# Patient Record
Sex: Female | Born: 1965 | Race: White | Hispanic: No | Marital: Single | Smoking: Never smoker
Health system: Southern US, Community
[De-identification: ages and names within clinical notes are randomized; demographics above are authoritative.]

## PROBLEM LIST (undated history)

## (undated) DIAGNOSIS — I2510 Atherosclerotic heart disease of native coronary artery without angina pectoris: Secondary | ICD-10-CM

## (undated) DIAGNOSIS — I4891 Unspecified atrial fibrillation: Secondary | ICD-10-CM

## (undated) DIAGNOSIS — Z853 Personal history of malignant neoplasm of breast: Secondary | ICD-10-CM

## (undated) DIAGNOSIS — E119 Type 2 diabetes mellitus without complications: Secondary | ICD-10-CM

## (undated) DIAGNOSIS — Z8739 Personal history of other diseases of the musculoskeletal system and connective tissue: Secondary | ICD-10-CM

## (undated) DIAGNOSIS — H5440 Blindness, one eye, unspecified eye: Secondary | ICD-10-CM

## (undated) DIAGNOSIS — F319 Bipolar disorder, unspecified: Secondary | ICD-10-CM

## (undated) DIAGNOSIS — J449 Chronic obstructive pulmonary disease, unspecified: Secondary | ICD-10-CM

## (undated) DIAGNOSIS — R55 Syncope and collapse: Secondary | ICD-10-CM

## (undated) DIAGNOSIS — I219 Acute myocardial infarction, unspecified: Secondary | ICD-10-CM

## (undated) DIAGNOSIS — G4733 Obstructive sleep apnea (adult) (pediatric): Secondary | ICD-10-CM

## (undated) HISTORY — DX: Bipolar disorder, unspecified: F31.9

## (undated) HISTORY — DX: Blindness, one eye, unspecified eye: H54.40

## (undated) HISTORY — DX: Type 2 diabetes mellitus without complications: E11.9

## (undated) HISTORY — DX: Personal history of malignant neoplasm of breast: Z85.3

## (undated) HISTORY — DX: Atherosclerotic heart disease of native coronary artery without angina pectoris: I25.10

## (undated) HISTORY — DX: Obstructive sleep apnea (adult) (pediatric): G47.33

## (undated) HISTORY — PX: CHOLECYSTECTOMY: SHX55

## (undated) HISTORY — PX: TONSILLECTOMY: SUR1361

## (undated) HISTORY — DX: Acute myocardial infarction, unspecified: I21.9

## (undated) HISTORY — DX: Chronic obstructive pulmonary disease, unspecified: J44.9

## (undated) HISTORY — DX: Morbid (severe) obesity due to excess calories: E66.01

## (undated) HISTORY — PX: MASTECTOMY: SHX3

## (undated) HISTORY — DX: Syncope and collapse: R55

## (undated) HISTORY — DX: Unspecified atrial fibrillation: I48.91

## (undated) HISTORY — PX: ROUX-EN-Y PROCEDURE: SUR1287

## (undated) HISTORY — PX: HYSTERECTOMY ABDOMINAL WITH SALPINGECTOMY: SHX6725

## (undated) HISTORY — DX: Personal history of other diseases of the musculoskeletal system and connective tissue: Z87.39

---

## 1998-09-07 HISTORY — PX: BREAST LUMPECTOMY: SHX2

## 2004-09-07 DIAGNOSIS — I219 Acute myocardial infarction, unspecified: Secondary | ICD-10-CM

## 2004-09-07 HISTORY — DX: Acute myocardial infarction, unspecified: I21.9

## 2010-09-07 HISTORY — PX: PERCUTANEOUS CORONARY STENT INTERVENTION (PCI-S): SHX6016

## 2010-09-07 HISTORY — PX: CARDIAC CATHETERIZATION: SHX172

## 2016-02-27 ENCOUNTER — Encounter: Payer: Self-pay | Admitting: *Deleted

## 2016-02-28 ENCOUNTER — Ambulatory Visit (INDEPENDENT_AMBULATORY_CARE_PROVIDER_SITE_OTHER): Payer: Medicare Other | Admitting: Cardiology

## 2016-02-28 ENCOUNTER — Encounter: Payer: Self-pay | Admitting: Cardiology

## 2016-02-28 VITALS — BP 124/86 | HR 73 | Ht 66.0 in | Wt 311.0 lb

## 2016-02-28 DIAGNOSIS — I2510 Atherosclerotic heart disease of native coronary artery without angina pectoris: Secondary | ICD-10-CM

## 2016-02-28 DIAGNOSIS — R55 Syncope and collapse: Secondary | ICD-10-CM | POA: Diagnosis not present

## 2016-02-28 DIAGNOSIS — R002 Palpitations: Secondary | ICD-10-CM

## 2016-02-28 MED ORDER — MIDODRINE HCL 5 MG PO TABS: 5.0000 mg | ORAL_TABLET | Freq: Two times a day (BID) | ORAL | Status: DC

## 2016-02-28 NOTE — Progress Notes (Signed)
Clinical Summary [name] is a 50 y.o.female seen today as a new patient, she is referrred by [facility] for the following medical problems.   1. Syncope - recent admission to [facility] with syncope 02/12/16 - no arrhythmias noted on telemetry, EKG reviewed and shows sinus brady to 52 and 1st degree AV block.  - 02/2016 CT PE negative - previous 24 hr holter 01/2012 with SVT lasting a few minutes, up to 150 bpm. Occasional brady episodes to high 40s - echo 01/2012 LVEF 69-79%, normal diastolic function, no significantl valve disease - episode occurred while at [facility]. Stood up from car, took a few steps and fell. Felt heart racing initially when she stood up. Just blacked out. Transient LOC and fell to ground. Tried to get up but felt lightheaded again. Came to felt dizzy, heart was racing.  - can have orthostatic symptoms chronically, either lying to sitting or sitting to standing - previous episodes of syncope. Last one early January. Occurred while standing in kitchen shortly after standing.  - total episodes around 20-25 episodes over the last several yearsEpisodes typically occur with position change  - drinks gallon of flavored water a day, occasional sodas, occaional tea, no energy drinks, coffee x 2 cups in AM, occasional EtoH - takes lasix about twice a week only   2. Palpitations - previous holter 01/2012 with SVT lasting a few minutes, up to 150 bpm.  - reports occasional palpitaitons.    3. CAD - reports previous MI in 2006 at [place], [place]. Treated medically - reports procedure in 2012 where something was placed in her heart, some type of valve she is unsure     Past Medical History  Diagnosis Date  . CAD (coronary artery disease)     saw Dr. Luiz Riley in [place]  . Myocardial infarction ([place]) 2006  . Syncope   . Atrial fibrillation ([place])   . COPD (chronic obstructive pulmonary disease) ([place])     never smoked  . Diabetes mellitus ([place])     type 2  .  Bipolar mood disorder ([place])     followed by psychiatrist and is on lithium  . H/O juvenile rheumatoid arthritis     w/o adult manifestations  . OSA (obstructive sleep apnea)     on CPAP  . HX: breast cancer   . Morbid obesity ([place])   . 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 Bee Venom Anaphylaxis  . Latex Anaphylaxis  . Erythromycin Rash  . Milk-Related Compounds Nausea Only and Rash  . Nylon Rash  . Poison [employer] Poison Ivy] Hives and Rash    [place], [place], [place]  . Soap Rash    Dial and Ivory soap  . Sulfa Antibiotics Rash     Current Outpatient Prescriptions  Medication Sig Dispense Refill  . ALPRAZolam (XANAX) 0.5 MG tablet Take 0.5 mg by mouth 3 (three) times daily as needed.     Marland Kitchen aspirin EC 81 MG tablet Take 81 mg by mouth daily.    . Cyanocobalamin (B-12) 1000 MCG/ML KIT Inject 1,000 mcg as directed once a week.    . Cyanocobalamin (B-12) 2500 MCG TABS Take 1 tablet by mouth daily.    Marland Kitchen EPINEPHrine 0.3 mg/0.3 mL IJ SOAJ injection Inject 0.3 mg into the muscle as directed.    Marland Kitchen esomeprazole (NEXIUM) 40 MG capsule Take 40  mg by mouth daily as needed.     . Fluticasone-Salmeterol (ADVAIR DISKUS) 500-50 MCG/DOSE AEPB Inhale 1 puff into the lungs as needed.     . folic acid (FOLVITE) 970 MCG tablet Take 800 mcg by mouth daily.    . furosemide (LASIX) 20 MG tablet Take 40 mg by mouth daily as needed.     . hydrOXYzine (ATARAX/VISTARIL) 25 MG tablet Take 25 mg by mouth as needed.    . lithium carbonate 300 MG capsule Take 300 mg by mouth 2 (two) times daily with a meal.    . meclizine (ANTIVERT) 25 MG tablet Take 25 mg by mouth 3 (three) times daily as needed for dizziness.    . montelukast (SINGULAIR) 10 MG tablet Take 10 mg by mouth at bedtime.    . Multiple Vitamin (MULTIVITAMIN) tablet Take 1 tablet by mouth daily.    . nebivolol (BYSTOLIC) 2.5 MG tablet Take 2.5 mg by mouth daily.    . nitroGLYCERIN  (NITROSTAT) 0.4 MG SL tablet Place 0.4 mg under the tongue every 5 (five) minutes as needed for chest pain.    . potassium chloride (K-DUR) 10 MEQ tablet Take 10 mEq by mouth daily.    . pramipexole (MIRAPEX) 1 MG tablet Take 1 tablet by mouth 2 (two) times daily.  1  . PROAIR HFA 108 (90 Base) MCG/ACT inhaler Inhale 2 puffs into the lungs 3 (three) times daily as needed.  5  . sertraline (ZOLOFT) 100 MG tablet Take 200 mg by mouth daily.     Marland Kitchen topiramate (TOPAMAX) 100 MG tablet Take 100 mg by mouth 2 (two) times daily.    . traMADol (ULTRAM) 50 MG tablet Take 50 mg by mouth every 6 (six) hours as needed.     . midodrine (PROAMATINE) 5 MG tablet Take 1 tablet (5 mg total) by mouth 2 (two) times daily with a meal. 60 tablet 3   No current facility-administered medications for this visit.     Past Surgical History  Procedure Laterality Date  . Cardiac catheterization  2012    stent placement  . Percutaneous coronary stent intervention (pci-s)  2012  . Breast lumpectomy  2000  . Mastectomy Bilateral 2014 & 2015  . Roux-en-y procedure      [facility]  . Cholecystectomy    . Tonsillectomy    . Hysterectomy abdominal with salpingectomy       Allergies  Allergen Reactions  . Bee Venom Anaphylaxis  . Latex Anaphylaxis  . Erythromycin Rash  . Milk-Related Compounds Nausea Only and Rash  . Nylon Rash  . Poison [employer] Poison Ivy] Hives and Rash    [place], [place], [place]  . Soap Rash    Dial and Ivory soap  . Sulfa Antibiotics Rash      No family history on file.   Social History [name] reports that she has never smoked. She has never used smokeless tobacco. [name] has no alcohol history on file.   Review of Systems CONSTITUTIONAL: No weight loss, fever, chills, weakness or fatigue.  HEENT: Eyes: No visual loss, blurred vision, double vision or yellow sclerae.No hearing loss, sneezing, congestion, runny nose or sore throat.  SKIN: No rash or itching.    CARDIOVASCULAR: per HPI RESPIRATORY: No shortness of breath, cough or sputum.  GASTROINTESTINAL: No anorexia, nausea, vomiting or diarrhea. No abdominal pain or blood.  GENITOURINARY: No burning on urination, no polyuria NEUROLOGICAL: per HPI  MUSCULOSKELETAL: No muscle, back pain,  joint pain or stiffness.  LYMPHATICS: No enlarged nodes. No history of splenectomy.  PSYCHIATRIC: No history of depression or anxiety.  ENDOCRINOLOGIC: No reports of sweating, cold or heat intolerance. No polyuria or polydipsia.  Marland Kitchen   Physical Examination Filed Vitals:   02/28/16 1400 02/28/16 1403  BP: 158/76 124/86  Pulse: 70 73   Filed Weights   02/28/16 1336  Weight: 311 lb (141.069 kg)    Gen: resting comfortably, no acute distress HEENT: no scleral icterus, pupils equal round and reactive, no palptable cervical adenopathy,  CV: RRR, no m/r/g, no jvd  Resp: Clear to auscultation bilaterally GI: abdomen is soft, non-tender, non-distended, normal bowel sounds, no hepatosplenomegaly MSK: extremities are warm, no edema.  Skin: warm, no rash Neuro:  no focal deficits Psych: appropriate affect    Assessment and Plan  1. Syncope - episodes consistent with orthostatic syncope, occur with position change. Orthostatics in clinic somewhat mixed, but she does appear to be orthostatic by pulse - reports significant fluid intake. Potentially medication or DM2 related orthostatic changes - we will try midodrine '5mg'$  bid and follow symptoms - the other consideration for her episodes would be arrhythmia, previous monitor a few years ago showed episodes of SVT as well as some episodes of bradycardia. We will repeat 7 day monitor to further evaluate  2. Palpitaitons - obtain 7 day event monitor  3. CAD - unclear history , request records from previous cardiologist.    F/u 3-4 weeks .Repeat orthostatics next visit   Arnoldo Lenis, M.D.

## 2016-02-28 NOTE — Patient Instructions (Signed)
Your physician recommends that you schedule a follow-up appointment in: 3-4 WEEKS WITH DR. BRANCH  Your physician has recommended you make the following change in your medication:   START MIDODRINE 5 MG TWICE DAILY  Your physician has recommended that you wear an event monitor 7 DAYS . Event monitors are medical devices that record the heart's electrical activity. Doctors most often us these monitors to diagnose arrhythmias. Arrhythmias are problems with the speed or rhythm of the heartbeat. The monitor is a small, portable device. You can wear one while you do your normal daily activities. This is usually used to diagnose what is causing palpitations/syncope (passing out).  Thank you for choosing [facility]!!

## 2016-03-12 ENCOUNTER — Ambulatory Visit (INDEPENDENT_AMBULATORY_CARE_PROVIDER_SITE_OTHER): Payer: Medicare Other

## 2016-03-12 DIAGNOSIS — R002 Palpitations: Secondary | ICD-10-CM | POA: Diagnosis not present

## 2016-03-25 ENCOUNTER — Ambulatory Visit (INDEPENDENT_AMBULATORY_CARE_PROVIDER_SITE_OTHER): Payer: Medicare Other | Admitting: Cardiology

## 2016-03-25 ENCOUNTER — Encounter: Payer: Self-pay | Admitting: Cardiology

## 2016-03-25 ENCOUNTER — Telehealth: Payer: Self-pay | Admitting: *Deleted

## 2016-03-25 VITALS — BP 133/96 | HR 84 | Ht 66.0 in | Wt 316.0 lb

## 2016-03-25 DIAGNOSIS — R55 Syncope and collapse: Secondary | ICD-10-CM

## 2016-03-25 DIAGNOSIS — R002 Palpitations: Secondary | ICD-10-CM | POA: Diagnosis not present

## 2016-03-25 MED ORDER — METOPROLOL TARTRATE 25 MG PO TABS: ORAL_TABLET | ORAL | Status: AC

## 2016-03-25 NOTE — Patient Instructions (Signed)
Your physician wants you to follow-up in: 6 MONTHS WITH DR. BRANCH You will receive a reminder letter in the mail two months in advance. If you don't receive a letter, please call our office to schedule the follow-up appointment.  Your physician has recommended you make the following change in your medication:   START LOPRESSOR 12.5 MG EVERY 8 HOURS AS NEEDED   Thank you for choosing [facility]!!

## 2016-03-25 NOTE — Telephone Encounter (Signed)
LM X 2 days. Pt has OV 7/19 with Dr. Wyline MoodBranch

## 2016-03-25 NOTE — Progress Notes (Signed)
Clinical Summary [name] is a 50 y.o.female seen today for follow up of the following medical problems.  1. Syncope - recent admission to [facility] with syncope 02/12/16 - no arrhythmias noted on telemetry, EKG reviewed and shows sinus brady to 52 and 1st degree AV block.  - 02/2016 CT PE negative - previous 24 hr holter 01/2012 with SVT lasting a few minutes, up to 150 bpm. Occasional brady episodes to high 40s - echo 01/2012 LVEF 94-76%, normal diastolic function, no significantl valve disease - episode occurred while at [facility]. Stood up from car, took a few steps and fell. Felt heart racing initially when she stood up. Just blacked out. Transient LOC and fell to ground. Tried to get up but felt lightheaded again. Came to felt dizzy, heart was racing.  - can have orthostatic symptoms chronically, either lying to sitting or sitting to standing - previous episodes of syncope. Last one early January. Occurred while standing in kitchen shortly after standing. - total episodes around 20-25 episodes over the last several yearsEpisodes typically occur with position change - drinks gallon of flavored water a day, occasional sodas, occaional tea, no energy drinks, coffee x 2 cups in AM, occasional EtoH   - 03/2016 event monitor with occasional mild sinus bradycardia, no reported symptoms and no significant arrhythmias - since last visit no recurrent syncope, symptoms improved off bystolic.    2. Palpitations - previous holter 01/2012 with SVT lasting a few minutes, up to 150 bpm.  - 03/2016 event monitor with occasional mild sinus bradycardia, no reported symptoms and no significant arrhythmias - can have mild occasional palpitations.     Past Medical History  Diagnosis Date  . CAD (coronary artery disease)     saw Dr. Luiz [place] in [place]  . Myocardial infarction ([place]) 2006  . Syncope   . Atrial fibrillation ([place])   . COPD (chronic obstructive pulmonary disease) ([place])    never smoked  . Diabetes mellitus ([place])     type 2  . Bipolar mood disorder ([place])     followed by psychiatrist and is on lithium  . H/O juvenile rheumatoid arthritis     w/o adult manifestations  . OSA (obstructive sleep apnea)     on CPAP  . HX: breast cancer   . Morbid obesity ([place])   . 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 Bee Venom Anaphylaxis  . Latex Anaphylaxis  . Erythromycin Rash  . Milk-Related Compounds Nausea Only and Rash  . Nylon Rash  . Poison [employer] Poison Ivy] Hives and Rash    [place], [place], [place]  . Soap Rash    Dial and Ivory soap  . Sulfa Antibiotics Rash     Current Outpatient Prescriptions  Medication Sig Dispense Refill  . ALPRAZolam (XANAX) 0.5 MG tablet Take 0.5 mg by mouth 3 (three) times daily as needed.     Marland Kitchen aspirin EC 81 MG tablet Take 81 mg by mouth daily.    . Cyanocobalamin (B-12) 1000 MCG/ML KIT Inject 1,000 mcg as directed once a week.    . Cyanocobalamin (B-12) 2500 MCG TABS Take 1 tablet by mouth daily.    Marland Kitchen EPINEPHrine 0.3 mg/0.3 mL IJ SOAJ injection Inject 0.3 mg into the muscle as directed.    Marland Kitchen esomeprazole (NEXIUM) 40 MG capsule Take 40 mg by mouth daily as needed.     Marland Kitchen  Fluticasone-Salmeterol (ADVAIR DISKUS) 500-50 MCG/DOSE AEPB Inhale 1 puff into the lungs as needed.     . folic acid (FOLVITE) 676 MCG tablet Take 800 mcg by mouth daily.    . furosemide (LASIX) 20 MG tablet Take 40 mg by mouth daily as needed.     . hydrOXYzine (ATARAX/VISTARIL) 25 MG tablet Take 25 mg by mouth as needed.    . lithium carbonate 300 MG capsule Take 300 mg by mouth 2 (two) times daily with a meal.    . meclizine (ANTIVERT) 25 MG tablet Take 25 mg by mouth 3 (three) times daily as needed for dizziness.    . midodrine (PROAMATINE) 5 MG tablet Take 1 tablet (5 mg total) by mouth 2 (two) times daily with a meal. 60 tablet 3  . montelukast (SINGULAIR) 10 MG tablet Take 10 mg by mouth at  bedtime.    . Multiple Vitamin (MULTIVITAMIN) tablet Take 1 tablet by mouth daily.    . nebivolol (BYSTOLIC) 2.5 MG tablet Take 2.5 mg by mouth daily.    . nitroGLYCERIN (NITROSTAT) 0.4 MG SL tablet Place 0.4 mg under the tongue every 5 (five) minutes as needed for chest pain.    . potassium chloride (K-DUR) 10 MEQ tablet Take 10 mEq by mouth daily.    . pramipexole (MIRAPEX) 1 MG tablet Take 1 tablet by mouth 2 (two) times daily.  1  . PROAIR HFA 108 (90 Base) MCG/ACT inhaler Inhale 2 puffs into the lungs 3 (three) times daily as needed.  5  . sertraline (ZOLOFT) 100 MG tablet Take 200 mg by mouth daily.     Marland Kitchen topiramate (TOPAMAX) 100 MG tablet Take 100 mg by mouth 2 (two) times daily.    . traMADol (ULTRAM) 50 MG tablet Take 50 mg by mouth every 6 (six) hours as needed.      No current facility-administered medications for this visit.     Past Surgical History  Procedure Laterality Date  . Cardiac catheterization  2012    stent placement  . Percutaneous coronary stent intervention (pci-s)  2012  . Breast lumpectomy  2000  . Mastectomy Bilateral 2014 & 2015  . Roux-en-y procedure      [facility]  . Cholecystectomy    . Tonsillectomy    . Hysterectomy abdominal with salpingectomy       Allergies  Allergen Reactions  . Bee Venom Anaphylaxis  . Latex Anaphylaxis  . Erythromycin Rash  . Milk-Related Compounds Nausea Only and Rash  . Nylon Rash  . Poison [employer] Poison Ivy] Hives and Rash    [place], [place], [place]  . Soap Rash    Dial and Ivory soap  . Sulfa Antibiotics Rash      No family history on file.   Social History [name] reports that she has never smoked. She has never used smokeless tobacco. [name] has no alcohol history on file.   Review of Systems CONSTITUTIONAL: No weight loss, fever, chills, weakness or fatigue.  HEENT: Eyes: No visual loss, blurred vision, double vision or yellow sclerae.No hearing loss, sneezing, congestion,  runny nose or sore throat.  SKIN: No rash or itching.  CARDIOVASCULAR: per HPI RESPIRATORY: No shortness of breath, cough or sputum.  GASTROINTESTINAL: No anorexia, nausea, vomiting or diarrhea. No abdominal pain or blood.  GENITOURINARY: No burning on urination, no polyuria NEUROLOGICAL: No headache, dizziness, syncope, paralysis, ataxia, numbness or tingling in the extremities. No change in bowel or bladder control.  MUSCULOSKELETAL: No muscle, back pain, joint pain or stiffness.  LYMPHATICS: No enlarged nodes. No history of splenectomy.  PSYCHIATRIC: No history of depression or anxiety.  ENDOCRINOLOGIC: No reports of sweating, cold or heat intolerance. No polyuria or polydipsia.  Marland Kitchen   Physical Examination Filed Vitals:   03/25/16 1329 03/25/16 1332  BP: 130/88 133/96  Pulse: 83 84   Filed Vitals:   03/25/16 1321  Height: _0  (1.676 m)  Weight: 316 lb (143.337 kg)    Gen: resting comfortably, no acute distress HEENT: no scleral icterus, pupils equal round and reactive, no palptable cervical adenopathy,  CV: RRR, no m/r/g, no jvd Resp: Clear to auscultation bilaterally GI: abdomen is soft, non-tender, non-distended, normal bowel sounds, no hepatosplenomegaly MSK: extremities are warm, no edema.  Skin: warm, no rash Neuro:  no focal deficits Psych: appropriate affect   Diagnostic Studies  01/2012 echo [place]: LVEF 55-60%,  01/2012 Holter monitor: short episodes of SVT  Assessment and Plan   1. Syncope - episodes consistent with orthostatic syncope, occur with position change. Orthostatics in clinic somewhat mixed, but she did appear to be orthostatic by pulse last visit. Normal orthostatics this visit with resolution of symptoms.  - no recurrent symptoms since stopping bystolic and starting midodrine.   2. Palpitaitons - no significant abnormalities on recent monitor - start prn lopressor only, off daily bystolic now.    F/u 6 months     Arnoldo Lenis, M.D.

## 2016-03-25 NOTE — Telephone Encounter (Signed)
-----   Message from Antoine PocheJonathan F Branch, MD sent at 03/20/2016 11:19 AM EDT ----- Please resend this report, it is not showing up in epic to read. Please let her know that the results are normal and put a note in epic   Dominga FerryJ Branch MD ----- Message -----    From: Albertine PatriciaStaci T Dyesha Henault, CMA    Sent: 03/13/2016   3:00 PM      To: Antoine PocheJonathan F Branch, MD  Did it not come to your media manager in your inbox? Im trying to figure out how to upload this again and where it needs to go to   [employer]  Darrian Grzelak

## 2016-10-01 ENCOUNTER — Encounter: Payer: Self-pay | Admitting: Cardiology

## 2016-10-01 ENCOUNTER — Ambulatory Visit (INDEPENDENT_AMBULATORY_CARE_PROVIDER_SITE_OTHER): Payer: Medicare Other | Admitting: Cardiology

## 2016-10-01 VITALS — BP 110/78 | HR 87 | Ht 66.0 in | Wt 291.0 lb

## 2016-10-01 DIAGNOSIS — M79604 Pain in right leg: Secondary | ICD-10-CM | POA: Diagnosis not present

## 2016-10-01 DIAGNOSIS — R002 Palpitations: Secondary | ICD-10-CM | POA: Diagnosis not present

## 2016-10-01 DIAGNOSIS — M79605 Pain in left leg: Secondary | ICD-10-CM

## 2016-10-01 DIAGNOSIS — R55 Syncope and collapse: Secondary | ICD-10-CM

## 2016-10-01 NOTE — Patient Instructions (Addendum)
Medication Instructions:  Your physician recommends that you continue on your current medications as directed. Please refer to the Current Medication list given to you today.   Labwork: I WILL REQUEST A COPY OF LABS FROM PCP  Testing/Procedures: Your physician has requested that you have an ankle brachial index (ABI). During this test an ultrasound and blood pressure cuff are used to evaluate the arteries that supply the arms and legs with blood. Allow thirty minutes for this exam. There are no restrictions or special instructions.    Follow-Up: Your physician wants you to follow-up in: 6 MONTHS. You will receive a reminder letter in the mail two months in advance. If you don't receive a letter, please call our office to schedule the follow-up appointment.   Any Other Special Instructions Will Be Listed Below (If Applicable).     If you need a refill on your cardiac medications before your next appointment, please call your pharmacy.

## 2016-10-01 NOTE — Progress Notes (Signed)
Clinical Summary [name] is a 51 y.o.female seen today for follow up of the following medical problems.  1. Syncope - recent admission to [facility] with syncope 02/12/16 - no arrhythmias noted on telemetry, EKG reviewed and shows sinus brady to 52 and 1st degree AV block.  - 02/2016 CT PE negative - previous 24 hr holter 01/2012 with SVT lasting a few minutes, up to 150 bpm. Occasional brady episodes to high 40s - echo 01/2012 LVEF 16-38%, normal diastolic function, no significantl valve disease - episode occurred while at [facility]. Stood up from car, took a few steps and fell. Felt heart racing initially when she stood up. Just blacked out. Transient LOC and fell to ground. Tried to get up but felt lightheaded again. Came to felt dizzy, heart was racing.  - can have orthostatic symptoms chronically, either lying to sitting or sitting to standing - previous episodes of syncope. Last one early January. Occurred while standing in kitchen shortly after standing. - total episodes around 20-25 episodes over the last several years. Episodes typically occur with position change - drinks gallon of flavored water a day, occasional sodas, occaional tea, no energy drinks, coffee x 2 cups in AM, occasional EtoH   - 03/2016 event monitor with occasional mild sinus bradycardia, no reported symptoms and no significant arrhythmias - since last visit no recurrent syncope, symptoms improved off bystolic.   - no recent syncope. Can have some orthostatic symptoms with standing. Overall doing well with midodrine.   2. Palpitations - previous holter 01/2012 with SVT lasting a few minutes, up to 150 bpm.  - 03/2016 event monitor with occasional mild sinus bradycardia, no reported symptoms and no significant arrhythmias   - has prn lopressor. Only palpitaitons 1-2 times a months  3. Leg pains - bilateral calf pain. Legs stay cold. Stops and rest, resolves.      SH: just married in October  2017.    Past Medical History:  Diagnosis Date  . Atrial fibrillation ([place])   . Bipolar mood disorder ([place])    followed by psychiatrist and is on lithium  . 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 CAD (coronary artery disease)    saw Dr. Luiz Ochoa in [facility]  . COPD (chronic obstructive pulmonary disease) ([place])    never smoked  . Diabetes mellitus ([place])    type 2  . 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 HX: breast cancer   . Morbid obesity ([place])   . Myocardial infarction 2006  . OSA (obstructive sleep apnea)    on CPAP  . Syncope      Allergies  Allergen Reactions  . Bee Venom Anaphylaxis  . Latex Anaphylaxis  . Erythromycin Rash  . Milk-Related Compounds Nausea Only and Rash  . Nylon Rash  . Poison Ivy Extract [Poison Ivy Extract] Hives and Rash    [place], [place]  . Soap Rash    Dial and Ivory soap  . Sulfa Antibiotics Rash     Current Outpatient Prescriptions  Medication Sig Dispense Refill  . ALPRAZolam (XANAX) 0.5 MG tablet Take 0.5 mg by mouth 3 (three) times daily as needed.     Marland Kitchen aspirin EC 81 MG tablet Take 81 mg by mouth daily.    . Cyanocobalamin (B-12) 1000 MCG/ML KIT Inject 1,000 mcg as directed once a week.    . Cyanocobalamin (B-12) 2500 MCG TABS Take 1 tablet  by mouth daily.    Marland Kitchen EPINEPHrine 0.3 mg/0.3 mL IJ SOAJ injection Inject 0.3 mg into the muscle as directed.    Marland Kitchen esomeprazole (NEXIUM) 40 MG capsule Take 40 mg by mouth daily as needed.     . Fluticasone-Salmeterol (ADVAIR DISKUS) 500-50 MCG/DOSE AEPB Inhale 1 puff into the lungs as needed.     . folic acid (FOLVITE) 263 MCG tablet Take 800 mcg by mouth daily.    . furosemide (LASIX) 20 MG tablet Take 40 mg by mouth daily as needed.     . gabapentin (NEURONTIN) 100 MG capsule Take 1 capsule by mouth daily.    . hydrOXYzine (ATARAX/VISTARIL) 25 MG tablet Take 25 mg by mouth as needed.    . lithium carbonate 300 MG capsule Take 300 mg by mouth 2  (two) times daily with a meal.    . meclizine (ANTIVERT) 25 MG tablet Take 25 mg by mouth 3 (three) times daily as needed for dizziness.    . metoprolol tartrate (LOPRESSOR) 25 MG tablet TAKE 12.5 MG EVERY 8 HOURS AS NEEDED 90 tablet 3  . midodrine (PROAMATINE) 5 MG tablet Take 1 tablet (5 mg total) by mouth 2 (two) times daily with a meal. 60 tablet 3  . montelukast (SINGULAIR) 10 MG tablet Take 10 mg by mouth at bedtime.    . Multiple Vitamin (MULTIVITAMIN) tablet Take 1 tablet by mouth daily.    . nitroGLYCERIN (NITROSTAT) 0.4 MG SL tablet Place 0.4 mg under the tongue every 5 (five) minutes as needed for chest pain.    . potassium chloride (K-DUR) 10 MEQ tablet Take 10 mEq by mouth daily.    . pramipexole (MIRAPEX) 1 MG tablet Take 1 tablet by mouth 2 (two) times daily.  1  . PROAIR HFA 108 (90 Base) MCG/ACT inhaler Inhale 2 puffs into the lungs 3 (three) times daily as needed.  5  . sertraline (ZOLOFT) 100 MG tablet Take 200 mg by mouth daily.     Marland Kitchen topiramate (TOPAMAX) 100 MG tablet Take 100 mg by mouth 2 (two) times daily.    . traMADol (ULTRAM) 50 MG tablet Take 50 mg by mouth every 6 (six) hours as needed.     . Vitamin D, Ergocalciferol, (DRISDOL) 50000 units CAPS capsule Take 50,000 Units by mouth once a week.  4   No current facility-administered medications for this visit.      Past Surgical History:  Procedure Laterality Date  . BREAST LUMPECTOMY  2000  . CARDIAC CATHETERIZATION  2012   stent placement  . CHOLECYSTECTOMY    . HYSTERECTOMY ABDOMINAL WITH SALPINGECTOMY    . MASTECTOMY Bilateral 2014 & 2015  . PERCUTANEOUS CORONARY STENT INTERVENTION (PCI-S)  2012  . [place]  . TONSILLECTOMY       Allergies  Allergen Reactions  . Bee Venom Anaphylaxis  . Latex Anaphylaxis  . Erythromycin Rash  . Milk-Related Compounds Nausea Only and Rash  . Nylon Rash  . Poison Ivy Extract [Poison Ivy Extract] Hives and Rash    [place], [place]   . Soap Rash    Dial and Ivory soap  . Sulfa Antibiotics Rash      No family history on file.   Social History [name] reports that she has never smoked. She has never used smokeless tobacco. [name] has no alcohol history on file.   Review of Systems CONSTITUTIONAL: No weight loss, fever, chills, weakness or fatigue.  HEENT: Eyes: No visual loss, blurred vision, double vision or yellow sclerae.No hearing loss, sneezing, congestion, runny nose or sore throat.  SKIN: No rash or itching.  CARDIOVASCULAR: per hpi RESPIRATORY: No shortness of breath, cough or sputum.  GASTROINTESTINAL: No anorexia, nausea, vomiting or diarrhea. No abdominal pain or blood.  GENITOURINARY: No burning on urination, no polyuria NEUROLOGICAL: No headache, dizziness, syncope, paralysis, ataxia, numbness or tingling in the extremities. No change in bowel or bladder control.  MUSCULOSKELETAL: No muscle, back pain, joint pain or stiffness.  LYMPHATICS: No enlarged nodes. No history of splenectomy.  PSYCHIATRIC: No history of depression or anxiety.  ENDOCRINOLOGIC: No reports of sweating, cold or heat intolerance. No polyuria or polydipsia.  Marland Kitchen   Physical Examination Vitals:   10/01/16 1118  BP: 110/78  Pulse: 87   Vitals:   10/01/16 1118  Weight: 291 lb (132 kg)  Height: '5\' 6"'$  (1.676 m)    Gen: resting comfortably, no acute distress HEENT: no scleral icterus, pupils equal round and reactive, no palptable cervical adenopathy,  CV: RRR, no m//r,g no jvd Resp: Clear to auscultation bilaterally GI: abdomen is soft, non-tender, non-distended, normal bowel sounds, no hepatosplenomegaly MSK: extremities are warm, no edema.  Skin: warm, no rash Neuro:  no focal deficits Psych: appropriate affect   Diagnostic Studies 01/2012 echo [place]: LVEF 55-60%,  01/2012 Holter monitor: short episodes of SVT     Assessment and Plan  1. Syncope - episodes consistent with orthostatic syncope, -  no recurrent symptoms since stopping bystolic and starting midodrine.  - continue to monitor.   2. Palpitaitons - no significant abnormalities on recent monitor - overall symptoms are controlled, continue to monitor  3. Leg pains - obtain ABIs.   F/u 6 months      Arnoldo Lenis, M.D., F.A.C.C.

## 2016-11-04 ENCOUNTER — Other Ambulatory Visit: Payer: Self-pay | Admitting: Cardiology

## 2016-11-04 DIAGNOSIS — I739 Peripheral vascular disease, unspecified: Secondary | ICD-10-CM

## 2016-11-12 ENCOUNTER — Ambulatory Visit: Payer: Medicare Other

## 2016-11-12 DIAGNOSIS — I739 Peripheral vascular disease, unspecified: Secondary | ICD-10-CM | POA: Diagnosis not present

## 2016-11-17 ENCOUNTER — Telehealth: Payer: Self-pay | Admitting: *Deleted

## 2016-11-17 NOTE — Telephone Encounter (Signed)
Pt aware - routed to pcp  

## 2016-11-17 NOTE — Telephone Encounter (Signed)
-----   Message from Antoine PocheJonathan F Branch, MD sent at 11/17/2016 12:07 PM EDT ----- Normal circulation in legs based on ultrasound. She should discuss with pcp other possible causes for her pain   Dominga FerryJ Branch MD

## 2017-03-23 ENCOUNTER — Emergency Department (HOSPITAL_COMMUNITY)
Admission: EM | Admit: 2017-03-23 | Discharge: 2017-03-23 | Disposition: A | Discharge status: Home / Self Care | Payer: Medicare Other | Attending: Emergency Medicine | Admitting: Emergency Medicine

## 2017-03-23 ENCOUNTER — Emergency Department (HOSPITAL_COMMUNITY): Payer: Medicare Other

## 2017-03-23 ENCOUNTER — Encounter (HOSPITAL_COMMUNITY): Payer: Self-pay | Admitting: Emergency Medicine

## 2017-03-23 DIAGNOSIS — Y929 Unspecified place or not applicable: Secondary | ICD-10-CM | POA: Insufficient documentation

## 2017-03-23 DIAGNOSIS — Z79899 Other long term (current) drug therapy: Secondary | ICD-10-CM | POA: Diagnosis not present

## 2017-03-23 DIAGNOSIS — S6991XA Unspecified injury of right wrist, hand and finger(s), initial encounter: Secondary | ICD-10-CM | POA: Diagnosis present

## 2017-03-23 DIAGNOSIS — J449 Chronic obstructive pulmonary disease, unspecified: Secondary | ICD-10-CM | POA: Insufficient documentation

## 2017-03-23 DIAGNOSIS — Z7982 Long term (current) use of aspirin: Secondary | ICD-10-CM | POA: Diagnosis not present

## 2017-03-23 DIAGNOSIS — Y999 Unspecified external cause status: Secondary | ICD-10-CM | POA: Diagnosis not present

## 2017-03-23 DIAGNOSIS — E119 Type 2 diabetes mellitus without complications: Secondary | ICD-10-CM | POA: Insufficient documentation

## 2017-03-23 DIAGNOSIS — Z91040 Latex allergy status: Secondary | ICD-10-CM | POA: Diagnosis not present

## 2017-03-23 DIAGNOSIS — Y939 Activity, unspecified: Secondary | ICD-10-CM | POA: Diagnosis not present

## 2017-03-23 MED ORDER — NAPROXEN 500 MG PO TABS: 500.0000 mg | ORAL_TABLET | Freq: Two times a day (BID) | ORAL | 0 refills | Status: DC

## 2017-03-23 NOTE — Discharge Instructions (Signed)
Elevate and apply ice packs on and off to your finger. Keep splinted for at least 1 week. Call the orthopedic doctor listed to arrange a follow-up if her symptoms are not improving.

## 2017-03-23 NOTE — ED Provider Notes (Signed)
[place] [facility] DEPT Provider Note   CSN: [id number] Arrival date & time: 03/23/17  1614     History   Chief Complaint Chief Complaint  Patient presents with  . Hand Injury    HPI Whitney Riley is a 51 y.o. female.  HPI   Whitney Riley is a 51 y.o. female who presents to the Emergency Department complaining of Throbbing pain to her right index and middle fingers. States that her daughter who has bipolar disorder, but her fingers back earlier today. States that she felt a pop in her index finger. She now reports pain with movement of the finger and bruising to the palmar side of her finger. She denies numbness or tingling to the digit, wrist pain, and swelling.  She's not tried any therapies prior to arrival. No other injuries.   Past Medical History:  Diagnosis Date  . Atrial fibrillation ([place])   . Bipolar mood disorder ([place])    followed by psychiatrist and is on lithium  . 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 CAD (coronary artery disease)    saw Dr. Luiz Ochoa in [facility]  . COPD (chronic obstructive pulmonary disease) ([place])    never smoked  . Diabetes mellitus ([place])    type 2  . 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 HX: breast cancer   . Morbid obesity ([place])   . Myocardial infarction ([place]) 2006  . OSA (obstructive sleep apnea)    on CPAP  . Syncope     There are no active problems to display for this patient.   Past Surgical History:  Procedure Laterality Date  . BREAST LUMPECTOMY  2000  . CARDIAC CATHETERIZATION  2012   stent placement  . CHOLECYSTECTOMY    . HYSTERECTOMY ABDOMINAL WITH SALPINGECTOMY    . MASTECTOMY Bilateral 2014 & 2015  . PERCUTANEOUS CORONARY STENT INTERVENTION (PCI-S)  2012  . [place]  . TONSILLECTOMY      OB History    No data available       Home Medications    Prior to Admission medications   Medication Sig Start Date End Date Taking?  Authorizing Provider  ALPRAZolam Duanne Moron) 0.5 MG tablet Take 0.5 mg by mouth 3 (three) times daily as needed.     [provider]  aspirin EC 81 MG tablet Take 81 mg by mouth daily.    [provider]  Cyanocobalamin (B-12) 1000 MCG/ML KIT Inject 1,000 mcg as directed once a week.    [provider]  Cyanocobalamin (B-12) 2500 MCG TABS Take 1 tablet by mouth daily.    [provider]  EPINEPHrine 0.3 mg/0.3 mL IJ SOAJ injection Inject 0.3 mg into the muscle as directed.    [provider]  esomeprazole (NEXIUM) 40 MG capsule Take 40 mg by mouth daily as needed.     [provider]  Fluticasone-Salmeterol (ADVAIR DISKUS) 500-50 MCG/DOSE AEPB Inhale 1 puff into the lungs as needed.     [provider]  folic acid (FOLVITE) 536 MCG tablet Take 800 mcg by mouth daily.    [provider]  furosemide (LASIX) 20 MG tablet Take 40 mg by mouth daily as needed.     [provider]  gabapentin (NEURONTIN) 100 MG capsule Take 1 capsule by mouth 2 (two) times daily.  03/23/16   [provider]  hydrOXYzine (ATARAX/VISTARIL) 25 MG tablet  Take 25 mg by mouth as needed.    [provider]  lithium carbonate 300 MG capsule Take 300 mg by mouth 2 (two) times daily with a meal.    [provider]  meclizine (ANTIVERT) 25 MG tablet Take 25 mg by mouth 3 (three) times daily as needed for dizziness.    [provider]  metoprolol tartrate (LOPRESSOR) 25 MG tablet TAKE 12.5 MG EVERY 8 HOURS AS NEEDED 03/25/16   Arnoldo Lenis, MD  midodrine (PROAMATINE) 5 MG tablet Take 1 tablet (5 mg total) by mouth 2 (two) times daily with a meal. 02/28/16   Branch, Alphonse Guild, MD  montelukast (SINGULAIR) 10 MG tablet Take 10 mg by mouth at bedtime.    [provider]  Multiple Vitamin (MULTIVITAMIN) tablet Take 1 tablet by mouth daily.    [provider]  nitroGLYCERIN (NITROSTAT) 0.4 MG SL tablet  Place 0.4 mg under the tongue every 5 (five) minutes as needed for chest pain.    [provider]  potassium chloride (K-DUR) 10 MEQ tablet Take 10 mEq by mouth daily.    [provider]  pramipexole (MIRAPEX) 1 MG tablet Take 1 tablet by mouth 2 (two) times daily. 01/29/16   [provider]  PROAIR HFA 108 (90 Base) MCG/ACT inhaler Inhale 2 puffs into the lungs 3 (three) times daily as needed. 12/10/15   [provider]  sertraline (ZOLOFT) 100 MG tablet Take 200 mg by mouth daily.     [provider]  topiramate (TOPAMAX) 100 MG tablet Take 100 mg by mouth 2 (two) times daily.    [provider]  traMADol (ULTRAM) 50 MG tablet Take 50 mg by mouth every 6 (six) hours as needed.     [provider]  Vitamin D, Ergocalciferol, (DRISDOL) 50000 units CAPS capsule Take 50,000 Units by mouth once a week. 03/02/16   [provider]    Family History Family History  Problem Relation Age of Onset  . Breast cancer Mother   . Hypertension Mother   . Heart attack Father   . Diabetes Father   . Hypertension Father   . Asthma Father   . Mesothelioma Father   . Arthritis Brother   . Hypertension Brother   . Breast cancer Maternal Grandmother   . Heart failure Maternal Grandmother   . Diabetes Maternal Grandmother   . Hypertension Maternal Grandmother   . Breast cancer Paternal Grandmother   . Hypertension Paternal Grandmother   . Liver disease Paternal Grandfather     Social History Social History  Substance Use Topics  . Smoking status: Never Smoker  . Smokeless tobacco: Never Used  . Alcohol use No     Allergies   Bee venom; Latex; Beef-derived products; Erythromycin; Milk-related compounds; Nylon; Poison ivy extract [poison ivy extract]; Soap; and Sulfa antibiotics   Review of Systems Review of Systems  Constitutional: Negative for chills and fever.  Musculoskeletal: Positive for arthralgias (Pain to right index  and middle fingers.). Negative for joint swelling.  Skin: Negative for color change (Bruising to the index finger) and wound.  Neurological: Negative for weakness and numbness.  All other systems reviewed and are negative.    Physical Exam Updated Vital Signs BP 124/70 (BP Location: Right Arm)   Pulse 70   Temp 97.7 F (36.5 C) (Oral)   Resp 17   Ht _0  (1.676 m)   Wt 126.1 kg (278 lb)   SpO2 97%  BMI 44.87 kg/m   Physical Exam  Constitutional: She is oriented to person, place, and time. She appears well-developed and well-nourished. No distress.  HENT:  Head: Atraumatic.  Cardiovascular: Normal rate, regular rhythm and intact distal pulses.   Pulmonary/Chest: Effort normal and breath sounds normal. No respiratory distress.  Musculoskeletal: She exhibits tenderness. She exhibits no edema or deformity.  Tender to palpation of the proximal right index and middle fingers.  No bony deformity. No tenderness proximally  Neurological: She is alert and oriented to person, place, and time. No sensory deficit.  Skin: Skin is warm. Capillary refill takes less than 2 seconds.  Small amount of bruising to the palmar aspect of the proximal right index finger  Nursing note and vitals reviewed.    ED Treatments / Results  Labs (all labs ordered are listed, but only abnormal results are displayed) Labs Reviewed - No data to display  EKG  EKG Interpretation None       Radiology Dg Hand Complete Right  Result Date: 03/23/2017 CLINICAL DATA:  Injury.  Crushing injury to index finger. EXAM: RIGHT HAND - COMPLETE 3+ VIEW COMPARISON:  None. FINDINGS: There is no evidence of acute fracture or dislocation. Chronic benign appearing lucency involving within the tuft of the index finger is identified. There is no evidence of arthropathy or other focal bone abnormality. Soft tissues are unremarkable. IMPRESSION: No acute findings identified. Electronically Signed   By: Kerby Moors M.D.    On: 03/23/2017 16:54    Procedures Procedures (including critical care time)  Medications Ordered in ED Medications - No data to display   Initial Impression / Assessment and Plan / ED Course  I have reviewed the triage vital signs and the nursing notes.  Pertinent labs & imaging results that were available during my care of the patient were reviewed by me and considered in my medical decision making (see chart for details).     X-ray negative for fracture. Remains neurovascularly intact. No tenderness proximal to the fingers. Likely sprain Fingers splinted, patient agrees to elevate, ice, orthopedic follow-up if needed in 1-2 weeks.  Final Clinical Impressions(s) / ED Diagnoses   Final diagnoses:  Injury of finger of right hand, initial encounter    New Prescriptions New Prescriptions   No medications on file     Kem Parkinson, Hershal Coria 03/23/17 1816    Daleen Bo, MD 03/24/17 0000
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
## 2018-04-15 IMAGING — DX DG HAND COMPLETE 3+V*R*
3 series · 3 of 3 positions shown · non-contrast
Comparison: None.

CLINICAL DATA: Injury.  Crushing injury to index finger.

EXAM:
RIGHT HAND - COMPLETE 3+ VIEW

[hand pa]
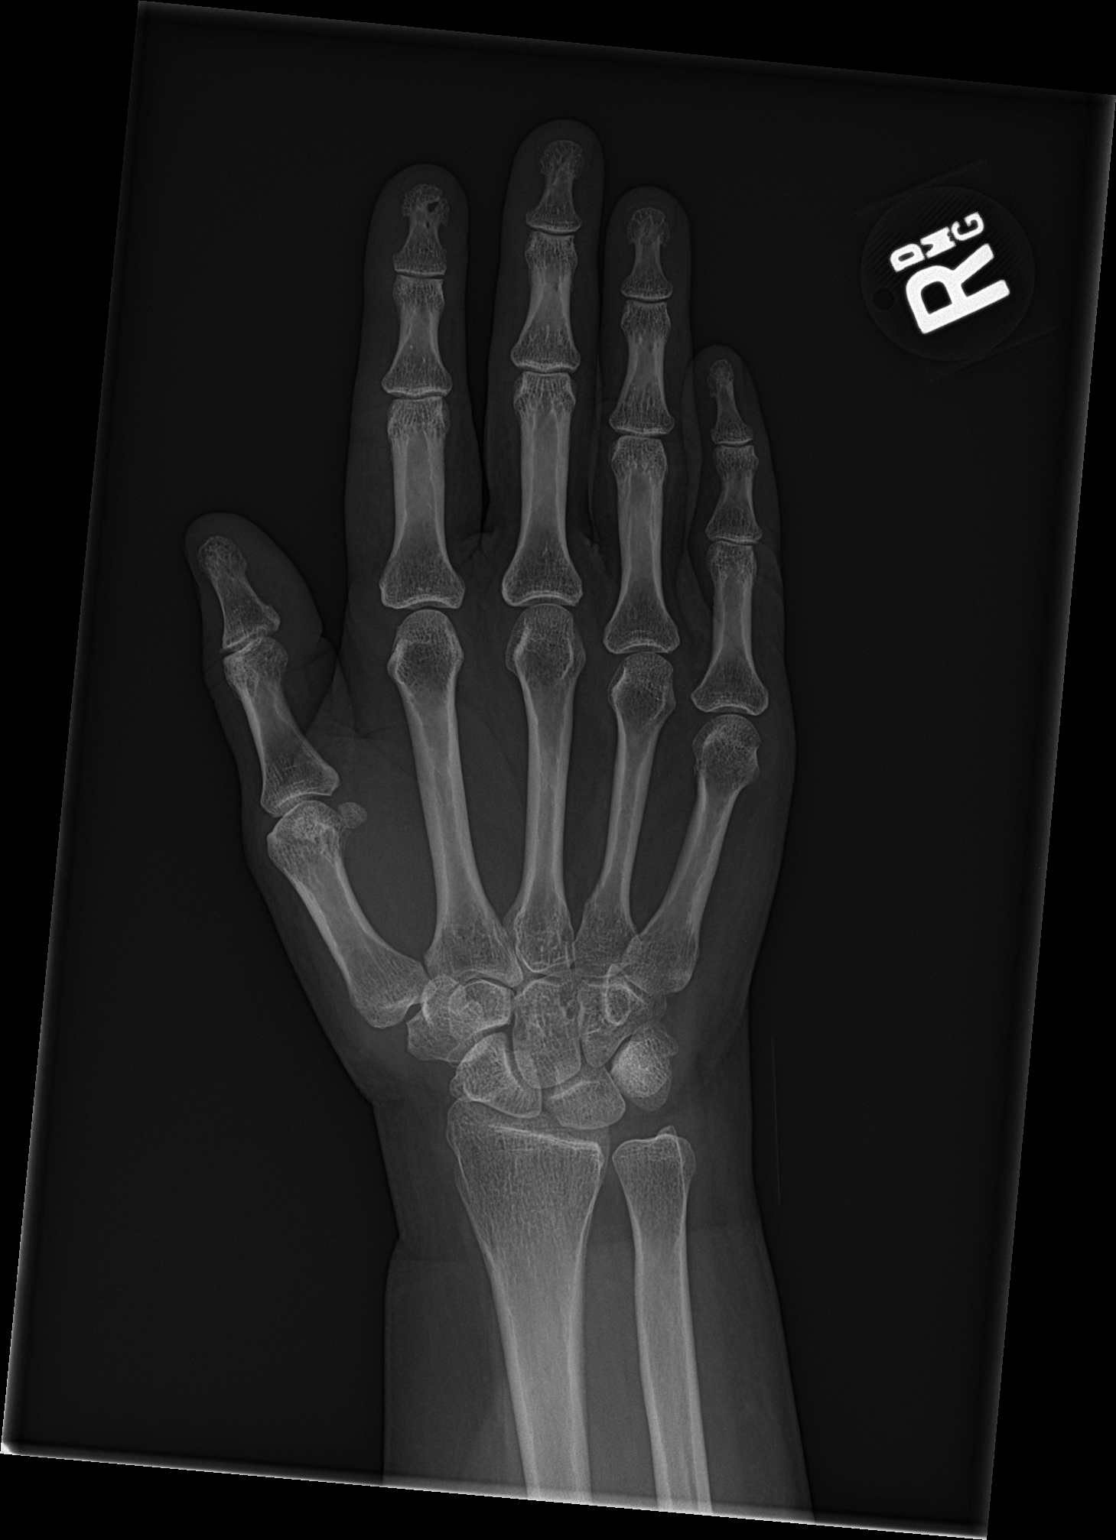

[hand obl]
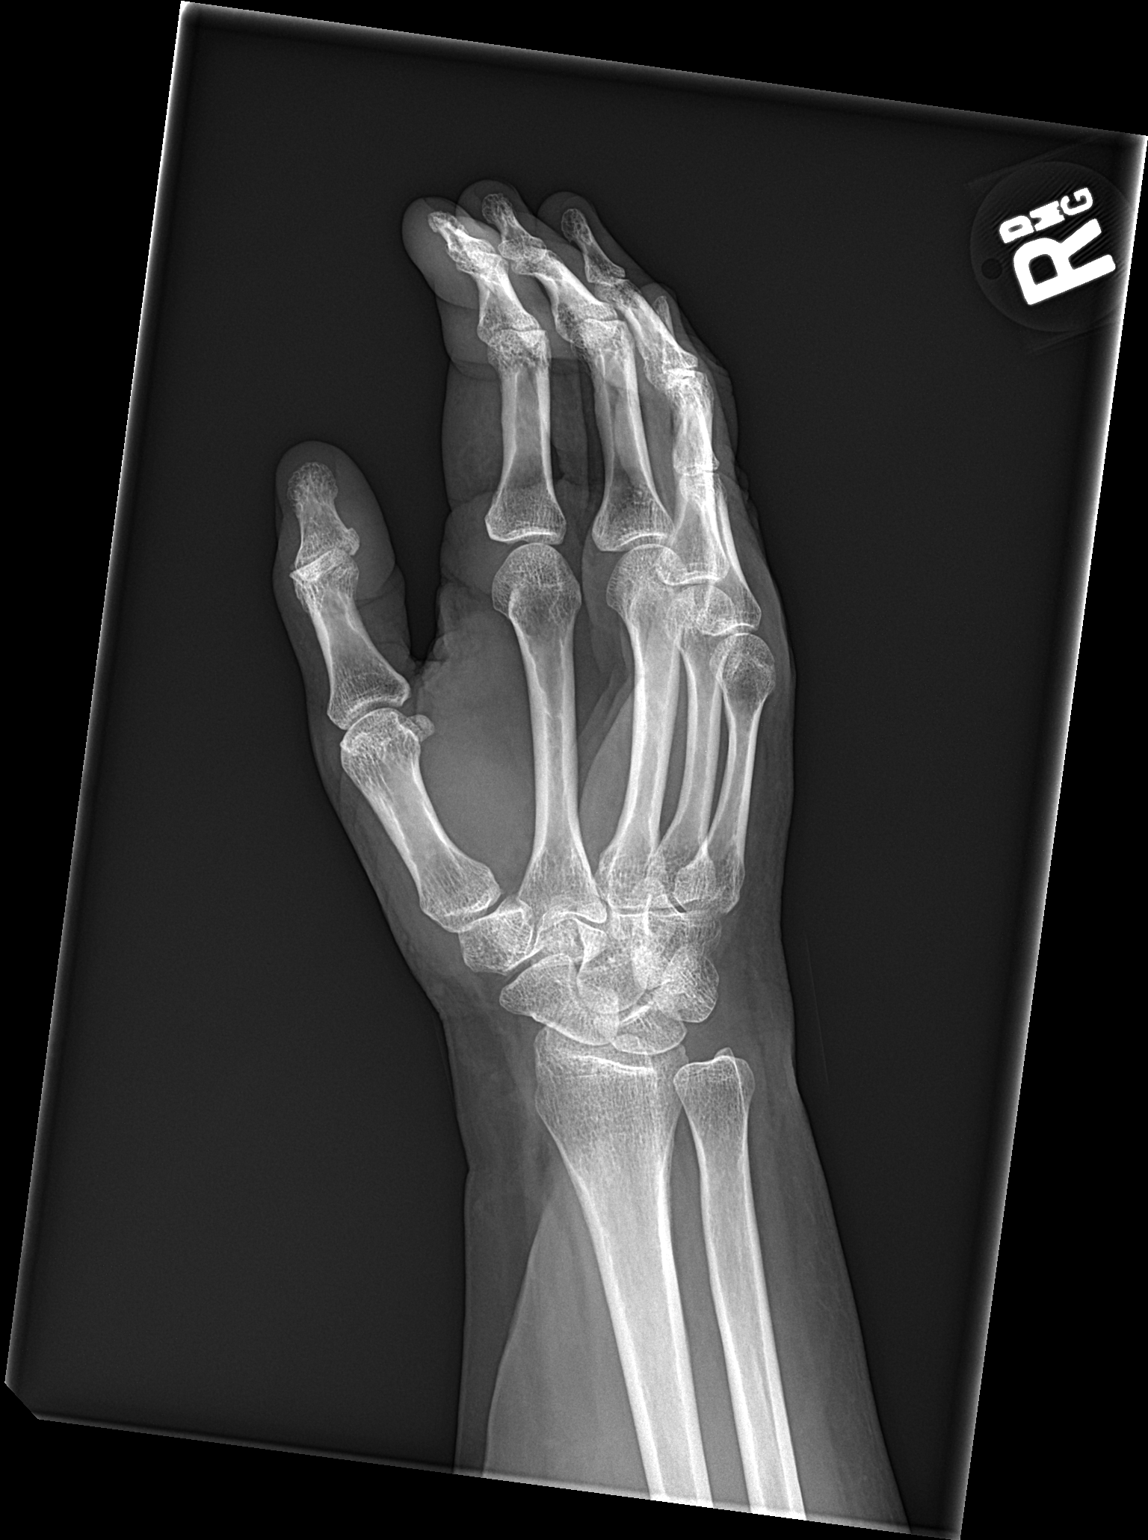

[hand lat]
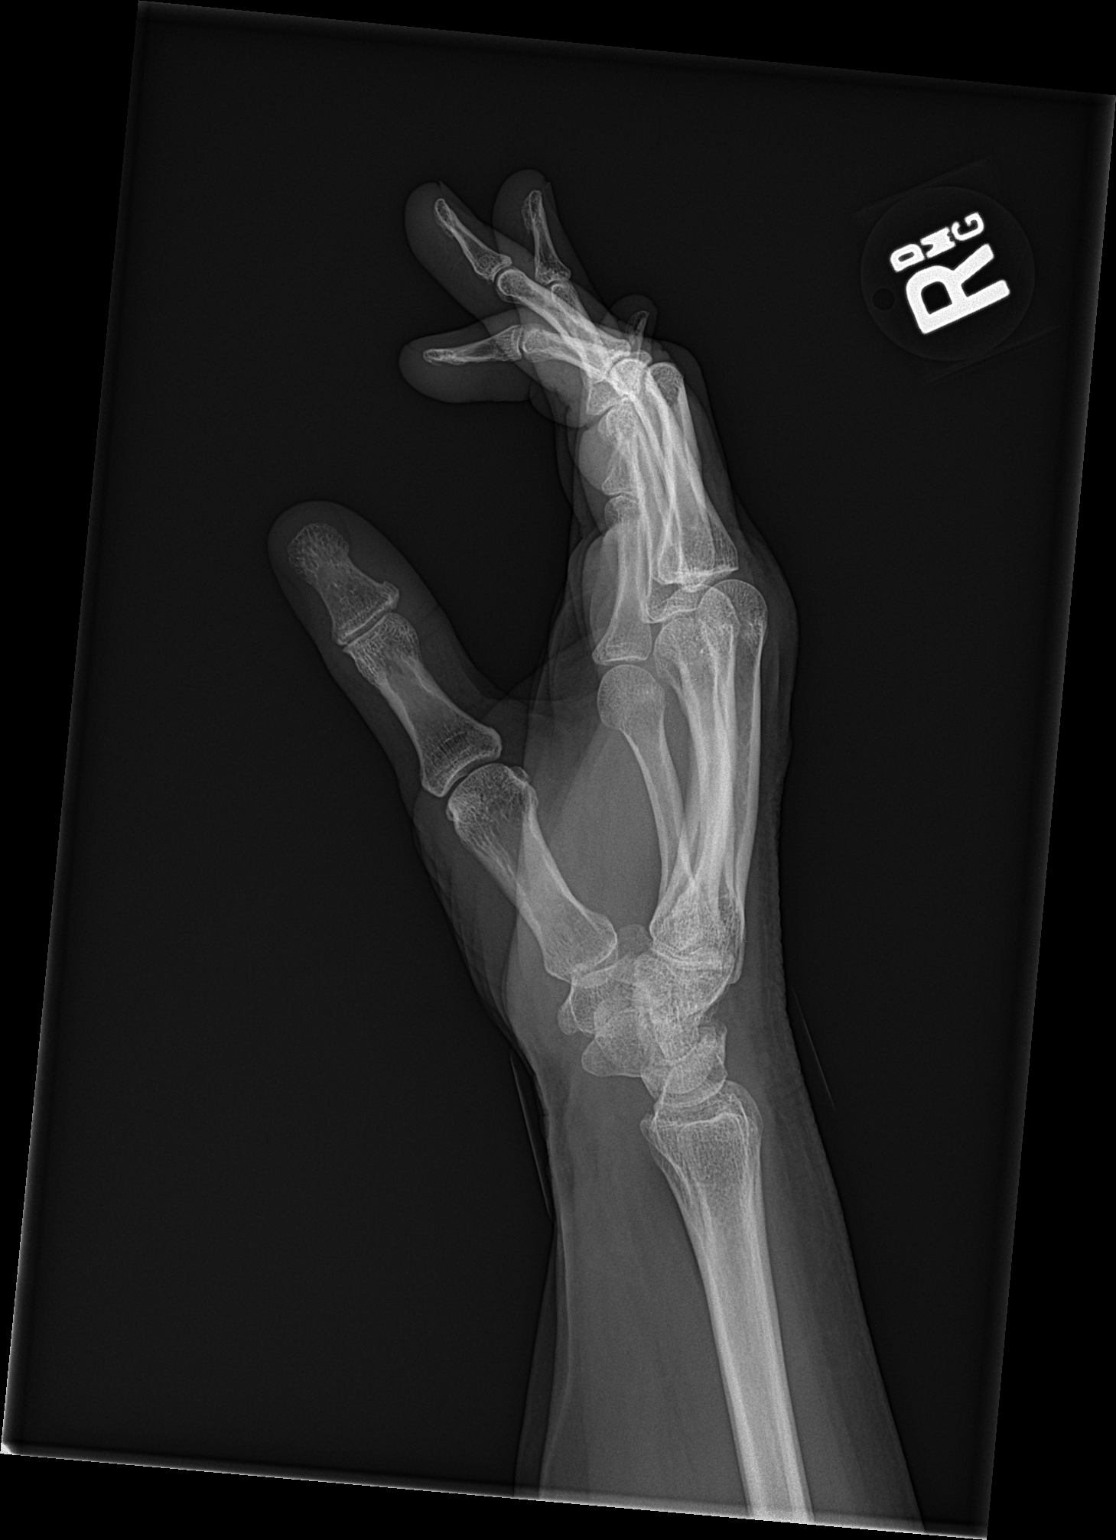

[3 of 3 positions shown; findings below may reference images not displayed]

FINDINGS: There is no evidence of acute fracture or dislocation. Chronic
benign appearing lucency involving within the tuft of the index
finger is identified. There is no evidence of arthropathy or other
focal bone abnormality. Soft tissues are unremarkable.
IMPRESSION: No acute findings identified.

## 2018-09-19 ENCOUNTER — Encounter: Payer: Self-pay | Admitting: Neurology

## 2018-09-19 ENCOUNTER — Ambulatory Visit (INDEPENDENT_AMBULATORY_CARE_PROVIDER_SITE_OTHER): Payer: Medicare Other | Admitting: Neurology

## 2018-09-19 VITALS — BP 135/88 | HR 64 | Ht 66.0 in | Wt 324.0 lb

## 2018-09-19 DIAGNOSIS — G4733 Obstructive sleep apnea (adult) (pediatric): Secondary | ICD-10-CM | POA: Diagnosis not present

## 2018-09-19 DIAGNOSIS — Z820 Family history of epilepsy and other diseases of the nervous system: Secondary | ICD-10-CM

## 2018-09-19 DIAGNOSIS — G2581 Restless legs syndrome: Secondary | ICD-10-CM

## 2018-09-19 DIAGNOSIS — R351 Nocturia: Secondary | ICD-10-CM | POA: Diagnosis not present

## 2018-09-19 DIAGNOSIS — R51 Headache: Secondary | ICD-10-CM

## 2018-09-19 DIAGNOSIS — Z6841 Body Mass Index (BMI) 40.0 and over, adult: Secondary | ICD-10-CM

## 2018-09-19 DIAGNOSIS — R519 Headache, unspecified: Secondary | ICD-10-CM

## 2018-09-19 NOTE — Progress Notes (Signed)
Subjective:    Patient ID: Whitney Riley is a 53 y.o. female.  HPI     Star Age, MD, PhD [facility] [address] [place], [place] [address]  Dear Rennis Petty,   I saw your patient, Whitney Riley, upon your kind request, in my neurologic clinic today for initial consultation of her leg pain, concern for including her restless leg syndrome. The patient is accompanied by her daughter today. As you know, Whitney Riley is a 53 year old right-handed woman with an underlying medical history of back pain (for which she has seen Dr. Merlene Laughter), reflux disease, asthma, history of seizure, OSA, COPD, history of syncope, hypertension, breast cancer with bilateral mastectomies, diabetes, neuropathy, hypertension and morbid obesity with a BMI of over 50 w status post weight loss surgery, who reports a prior diagnosis of obstructive sleep apnea, she has been on CPAP therapy for this but has not seen a sleep provider in sometime, sleep study was over 5 years ago and her machine is over 16 years old. Prior sleep study results are not available for my review today and a CPAP download was not available for my review today. She reports using her CPAP. She has been on Mirapex 1 mg bid for her restless leg symptoms. She also follows with pain management for back pain and uses Buprenorphine, and hydrocodone. Of note, she is on other potentially sedating medications including high-dose sertraline 200 mg daily, Topamax generic 100 mg twice daily, baclofen 10 mg 3 times a day and lithium 300 mg twice daily, meclizine 25 mg 3 times a day. She is followed by pain management. She is in the process of establishing care with a new psychiatrist as I understand. Her Epworth sleepiness score is 20 out of 24 today, fatigue score is 53 out of 63. She is married and lives with her husband and her daughter. She has 2 children. She doesn't work. She does not smoke cigarettes and drinks alcohol  occasionally, caffeine in the form of coffee, 2 cups per day on average.  She recently moved from [place], [place], to [place], [place]. She  reports having had restless legs syndrome since the 90s. She has had neuropathy for years, had EMG and nerve conduction testing years ago under Dr. Merlene Laughter. She was on gabapentin in the past and recently tried another medication which did not help or cause side effects. She does not recall the name of the prescription. She also reports that Dr. Merlene Laughter dismissed her from his practice, as she did not follow his recommendation on the new medication. I do not have any records available from her previous neurologists. She also saw a neurologist in [place]. She takes the Mirapex twice daily, around 1 PM and bedtime which is currently around 9:30 or 10. Some nights she sleeps well and some nights she doesn't. She tries to be consistent with her CPAP but wonders if it needs adjustment. She used to be on a pressure of 12 but some years ago was reduced to 7 cm she believes. She did not bring her machine. She was told that she had severe sleep apnea. She has lost weight in the past secondary to gastric bypass surgery in 2006. Her highest weight was around 600 pounds she states. Her rise time is around 5:30 AM because her husband gets up at the time. As far as her restless legs, she has some nights with significant discomfort and urges to move her legs, moving or shifting her  legs alleviates some of the symptoms. As far as her neuropathy, she has some discomfort, some aching, some burning sensation in her feet and some numbness. She has nocturia about 3 times per average night and has had the occasional morning headache, these improved after she started CPAP therapy. She reports that she has been on CPAP therapy for years, was diagnosed with sleep apnea in the 90s. Her father has sleep apnea and one of her daughters has sleep apnea. The other daughter has not been tested  but snores loudly. Patient had a tonsillectomy at age 28.  Her Past Medical History Is Significant For: Past Medical History:  Diagnosis Date  . Atrial fibrillation ([place])   . Bipolar mood disorder ([place])    followed by psychiatrist and is on lithium  . 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 CAD (coronary artery disease)    saw Dr. Luiz Ochoa in [place]  . COPD (chronic obstructive pulmonary disease) ([place])    never smoked  . Diabetes mellitus ([place])    type 2  . 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 HX: breast cancer   . Morbid obesity ([place])   . Myocardial infarction ([place]) 2006  . OSA (obstructive sleep apnea)    on CPAP  . Syncope     Her Past Surgical History Is Significant For: Past Surgical History:  Procedure Laterality Date  . BREAST LUMPECTOMY  2000  . CARDIAC CATHETERIZATION  2012   stent placement  . CHOLECYSTECTOMY    . HYSTERECTOMY ABDOMINAL WITH SALPINGECTOMY    . MASTECTOMY Bilateral 2014 & 2015  . PERCUTANEOUS CORONARY STENT INTERVENTION (PCI-S)  2012  . Cylinder  . TONSILLECTOMY      Her Family History Is Significant For: Family History  Problem Relation Age of Onset  . Breast cancer Mother   . Hypertension Mother   . Heart attack Father   . Diabetes Father   . Hypertension Father   . Asthma Father   . Mesothelioma Father   . Arthritis Brother   . Hypertension Brother   . Breast cancer Maternal Grandmother   . Heart failure Maternal Grandmother   . Diabetes Maternal Grandmother   . Hypertension Maternal Grandmother   . Breast cancer Paternal Grandmother   . Hypertension Paternal Grandmother   . Liver disease Paternal Grandfather     Her Social History Is Significant For: Social History   Socioeconomic History  . Marital status: Single    Spouse name: Not on file  . Number of children: Not on file  . Years of education: Not on file  . Highest education  level: Not on file  Occupational History  . Not on file  Social Needs  . Financial resource strain: Not on file  . Food insecurity:    Worry: Not on file    Inability: Not on file  . Transportation needs:    Medical: Not on file    Non-medical: Not on file  Tobacco Use  . Smoking status: Never Smoker  . Smokeless tobacco: Never Used  Substance and Sexual Activity  . Alcohol use: No    Alcohol/week: 0.0 standard drinks  . Drug use: No  . Sexual activity: Not on file  Lifestyle  . Physical activity:    Days per week: Not on file    Minutes per session: Not on file  . Stress: Not on file  Relationships  . Social connections:    Talks on phone: Not on file    Gets together: Not on file    Attends religious service: Not on file    Active member of club or organization: Not on file    Attends meetings of clubs or organizations: Not on file    Relationship status: Not on file  Other Topics Concern  . Not on file  Social History Narrative  . Not on file    Her Allergies Are:  Allergies  Allergen Reactions  . Bee Venom Anaphylaxis  . Latex Anaphylaxis  . Bactrim [Sulfamethoxazole-Trimethoprim]   . Beef-Derived Products     alpha gal  . Vancomycin   . Erythromycin Rash  . Milk-Related Compounds Nausea Only and Rash  . Nylon Rash  . Poison Ivy Extract [Poison Ivy Extract] Hives and Rash    [place], [place]  . Soap Rash    Dial and Ivory soap  . Sulfa Antibiotics Rash  :   Her Current Medications Are:  Outpatient Encounter Medications as of 09/19/2018  Medication Sig  . aspirin EC 81 MG tablet Take 81 mg by mouth daily.  . baclofen (LIORESAL) 10 MG tablet Take 10 mg by mouth 3 (three) times daily.   . Buprenorphine HCl (BELBUCA) 450 MCG FILM Place 1 strip inside cheek 2 (two) times daily.  . Cyanocobalamin (B-12) 1000 MCG/ML KIT Inject 1,000 mcg as directed once a week.  . Cyanocobalamin (B-12) 2500 MCG TABS Take 1 tablet by mouth daily.  Marland Kitchen EPINEPHrine 0.3 mg/0.3 mL  IJ SOAJ injection Inject 0.3 mg into the muscle as directed.  . Fluticasone-Salmeterol (ADVAIR DISKUS) 500-50 MCG/DOSE AEPB Inhale 1 puff into the lungs as needed.   . folic acid (FOLVITE) 482 MCG tablet Take 800 mcg by mouth daily.  . furosemide (LASIX) 20 MG tablet Take 40 mg by mouth daily as needed.   Marland Kitchen HYDROcodone-acetaminophen (NORCO) 7.5-325 MG tablet Take 1 tablet by mouth every 8 (eight) hours as needed for moderate pain.  . hydrOXYzine (ATARAX/VISTARIL) 25 MG tablet Take 25 mg by mouth as needed.  . lithium carbonate 300 MG capsule Take 300 mg by mouth 2 (two) times daily with a meal.  . meclizine (ANTIVERT) 25 MG tablet Take 25 mg by mouth 3 (three) times daily as needed for dizziness.  . metoprolol tartrate (LOPRESSOR) 25 MG tablet TAKE 12.5 MG EVERY 8 HOURS AS NEEDED  . montelukast (SINGULAIR) 10 MG tablet Take 10 mg by mouth at bedtime.  . Multiple Vitamin (MULTIVITAMIN) tablet Take 1 tablet by mouth daily.  . nitroGLYCERIN (NITROSTAT) 0.4 MG SL tablet Place 0.4 mg under the tongue every 5 (five) minutes as needed for chest pain.  . potassium chloride (K-DUR) 10 MEQ tablet Take 10 mEq by mouth daily.  . pramipexole (MIRAPEX) 1 MG tablet Take 1 tablet by mouth 2 (two) times daily.  Marland Kitchen PROAIR HFA 108 (90 Base) MCG/ACT inhaler Inhale 2 puffs into the lungs 3 (three) times daily as needed.  . sertraline (ZOLOFT) 100 MG tablet Take 200 mg by mouth daily.   Marland Kitchen topiramate (TOPAMAX) 100 MG tablet Take 100 mg by mouth 2 (two) times daily.  . Vitamin D, Ergocalciferol, (DRISDOL) 50000 units CAPS capsule Take 50,000 Units by mouth once a week.  . [DISCONTINUED] ALPRAZolam (XANAX) 0.5 MG tablet Take 0.5 mg by mouth 3 (three) times daily as needed.   . [DISCONTINUED] esomeprazole (NEXIUM) 40 MG capsule Take 40 mg by mouth daily as  needed.   . [DISCONTINUED] gabapentin (NEURONTIN) 100 MG capsule Take 1 capsule by mouth 2 (two) times daily.   . [DISCONTINUED] HYDROcodone-acetaminophen  (NORCO/VICODIN) 5-325 MG tablet Take 1 tablet by mouth every 6 (six) hours as needed for moderate pain.  . [DISCONTINUED] midodrine (PROAMATINE) 5 MG tablet Take 1 tablet (5 mg total) by mouth 2 (two) times daily with a meal.  . [DISCONTINUED] naproxen (NAPROSYN) 500 MG tablet Take 1 tablet (500 mg total) by mouth 2 (two) times daily with a meal.  . [DISCONTINUED] potassium chloride (K-DUR) 10 MEQ tablet Take 10 mEq by mouth daily.  . [DISCONTINUED] traMADol (ULTRAM) 50 MG tablet Take 50 mg by mouth every 6 (six) hours as needed.    No facility-administered encounter medications on file as of 09/19/2018.   :  Review of Systems:  Out of a complete 14 point review of systems, all are reviewed and negative with the exception of these symptoms as listed below: Review of Systems  Neurological:       Pt presents today to discuss her RLS, neuropathy, and osa on cpap. Pt reports that she does not have a sleep provider. Pt uses a cpap every night but did not bring it today. Her last sleep study was over 5 years ago and she believes that her cpap is older than 55 years old.  Epworth Sleepiness Scale 0= would never doze 1= slight chance of dozing 2= moderate chance of dozing 3= high chance of dozing  Sitting and reading: 3 Watching TV: 3 Sitting inactive in a public place (ex. Theater or meeting): 3 As a passenger in a car for an hour without a break: 3 Lying down to rest in the afternoon: 3 Sitting and talking to someone: 1 Sitting quietly after lunch (no alcohol): 3 In a car, while stopped in traffic: 1 Total: 20     Objective:  Neurological Exam  Physical Exam Physical Examination:   Vitals:   09/19/18 0957  BP: 135/88  Pulse: 64   General Examination: The patient is a very pleasant 53 y.o. female in no acute distress. She appears well-developed and well-nourished and well groomed.   HEENT: Normocephalic, atraumatic, pupils are equal, round and reactive to light and accommodation.  Corrective eye glasses in place. Extraocular tracking is good without limitation to gaze excursion or nystagmus noted. Normal smooth pursuit is noted. Hearing is grossly intact. Face is symmetric with normal facial animation and normal facial sensation. Speech is clear with no dysarthria noted. There is no hypophonia. There is no lip, neck/head, jaw or voice tremor. Neck is supple with full range of passive and active motion. There are no carotid bruits on auscultation. Oropharynx exam reveals: moderate mouth dryness, adequate dental hygiene with dentures on top and moderate airway crowding, due to small airway opening, and larger uvula. Mallampati is class III. Tongue protrudes centrally and palate elevates symmetrically. Tonsils are absent. Neck size is 15 inches. Several missing teeth on the bottom.   Chest: Clear to auscultation without wheezing, rhonchi or crackles noted.  Heart: S1+S2+0, regular and normal without murmurs, rubs or gallops noted.   Abdomen: Soft, non-tender and non-distended with normal bowel sounds appreciated on auscultation.  Extremities: There is 1+ pitting edema in the distal lower extremities bilaterally.   Skin: Warm and dry without trophic changes noted.  Musculoskeletal: exam reveals no obvious joint deformities, tenderness or joint swelling or erythema.   Neurologically:  Mental status: The patient is awake, alert and oriented in all  4 spheres. Her immediate and remote memory, attention, language skills and fund of knowledge are appropriate. There is no evidence of aphasia, agnosia, apraxia or anomia. Speech is clear with normal prosody and enunciation. Thought process is linear. Mood is normal and affect is normal.  Cranial nerves II - XII are as described above under HEENT exam. In addition: shoulder shrug is normal with equal shoulder height noted. Motor exam: Normal bulk, strength and tone is noted. There is no drift, tremor or rebound. Romberg is not tested d/t  safety concern.  Cerebellar testing: No dysmetria or intention tremor on finger to nose testing. Heel to shin is unremarkable bilaterally. There is no truncal or gait ataxia.  Sensory exam: intact to light touch, pinprick, vibration, temperature sense in the upper extremities, decreased to all modalities in the LEs up to knees.  Gait, station and balance: She stands with difficulty, walks slowly, no limp, no walking aid.   Assessment and plan:   In summary, Whitney Riley is a very pleasant 53 y.o.-year old female with an underlying medical history of back pain (for which she has seen Dr. Merlene Laughter), reflux disease, asthma, history of seizure, OSA, COPD, history of syncope, hypertension, breast cancer with bilateral mastectomies, diabetes, neuropathy, hypertension and morbid obesity with a BMI of over 50 w status post weight loss surgery, who presents for evaluation of her sleep disorder including obstructive sleep apnea for which she has not been seeing a sleep specialist in some time, and a diagnosis of versus leg syndrome. She has been on Mirapex for this and is reporting good results with a dose of 1 mg twice a day. She can maintain this prescription and is encouraged to follow with you for this prescription if possible for her convenience as she also gets other prescriptions from your office on a regular basis. For her sleep apnea, she will benefit from reevaluation as it has been years. She was told she had severe sleep apnea and her machine may be outdated as it may be 5 or more than 55 years old. She would be willing to come in for sleep study and we will also be on the look out for PLMS during her attended sleep study. She is indicating significant sleepiness and is advised not to drive when sleepy. Her sleepiness may be a combination of factors including not always sleeping very well, underlying obstructive sleep apnea and taking potentially sedating medications. She is advised that I would not  recommend any new medications. I had a long chat with the patient about my findings and the diagnosis of OSA, its prognosis and treatment options. We talked about medical treatments, surgical interventions and non-pharmacological approaches. I explained in particular the risks and ramifications of untreated moderate to severe OSA, especially with respect to developing cardiovascular disease down the Road, including congestive heart failure, difficult to treat hypertension, cardiac arrhythmias, or stroke. Even type 2 diabetes has, in part, been linked to untreated OSA. Symptoms of untreated OSA include daytime sleepiness, memory problems, mood irritability and mood disorder such as depression and anxiety, lack of energy, as well as recurrent headaches, especially morning headaches. We talked about maintaining a healthy lifestyle in general, as well as the importance of weight control. She is encouraged to continue to strive for weight loss. I recommended the following at this time: sleep study with potential positive airway pressure titration. (We will score hypopneas at 4%).   I plan to see her back after his sleep study is completed  and we will also keep her posted by phone call after I have had a chance to repeat her sleep study.  I answered all her questions today and the patient was in agreement. I plan to see her back after the sleep study is completed and encouraged her to call with any interim questions, concerns, problems or updates.   Thank you very much for allowing me to participate in the care of this nice patient. If I can be of any further assistance to you please do not hesitate to call me at [phone].  Sincerely,   Star Age, MD, PhD

## 2018-09-19 NOTE — Patient Instructions (Signed)
Thank you for choosing [facility] for your sleep related care! It was nice to meet you today! I appreciate that you entrust me with your sleep related healthcare concerns. I hope, I was able to address at least some of your concerns today, and that I can help you feel reassured and also get better.    Here is what we discussed today and what we came up with as our plan for you:    You can continue with the Mirapex 1 mg twice daily. I would recommend Dr. Sherril Croon maintain this prescription, if possible for your convenience.   Based on your symptoms and your exam I believe you are still at risk for obstructive sleep apnea and would benefit from reevaluation as it has been many years and you need new supplies and updated machine. Therefore, I think we should proceed with a sleep study to determine how severe your sleep apnea is. If you have more than mild OSA, I want you to consider ongoing treatment with CPAP. Please remember, the risks and ramifications of moderate to severe obstructive sleep apnea or OSA are: Cardiovascular disease, including congestive heart failure, stroke, difficult to control hypertension, arrhythmias, and even type 2 diabetes has been linked to untreated OSA. Sleep apnea causes disruption of sleep and sleep deprivation in most cases, which, in turn, can cause recurrent headaches, problems with memory, mood, concentration, focus, and vigilance. Most people with untreated sleep apnea report excessive daytime sleepiness, which can affect their ability to drive. Please do not drive if you feel sleepy.   I will likely see you back after your sleep study to go over the test results and where to go from there. We will call you after your sleep study to advise about the results (most likely, you will hear from [place], my nurse) and to set up an appointment at the time, as necessary.    Our sleep lab administrative assistant will call you to schedule your sleep study. If you  don't hear back from her by about 2 weeks from now, please feel free to call her at [phone]. You can leave a message with your phone number and concerns, if you get the voicemail box. She will call back as soon as possible.

## 2018-10-07 ENCOUNTER — Ambulatory Visit (INDEPENDENT_AMBULATORY_CARE_PROVIDER_SITE_OTHER): Payer: Medicare Other | Admitting: Neurology

## 2018-10-07 DIAGNOSIS — G4733 Obstructive sleep apnea (adult) (pediatric): Secondary | ICD-10-CM | POA: Diagnosis not present

## 2018-10-07 DIAGNOSIS — R51 Headache: Secondary | ICD-10-CM

## 2018-10-07 DIAGNOSIS — Z820 Family history of epilepsy and other diseases of the nervous system: Secondary | ICD-10-CM

## 2018-10-07 DIAGNOSIS — Z6841 Body Mass Index (BMI) 40.0 and over, adult: Principal | ICD-10-CM

## 2018-10-07 DIAGNOSIS — G2581 Restless legs syndrome: Secondary | ICD-10-CM

## 2018-10-07 DIAGNOSIS — R351 Nocturia: Secondary | ICD-10-CM

## 2018-10-07 DIAGNOSIS — R519 Headache, unspecified: Secondary | ICD-10-CM

## 2018-10-11 ENCOUNTER — Telehealth: Payer: Self-pay

## 2018-10-11 NOTE — Procedures (Signed)
PATIENT'S NAME:  Whitney Riley, Whitney Riley DOB:      Aug 24, 1966      MR#:    [id number]     DATE OF RECORDING: 10/07/2018 REFERRING M.D.:  Ignatius Specking, MD Study Performed:   Baseline Polysomnogram HISTORY: 53 year old right-handed woman with an underlying medical history of back pain, reflux disease, asthma, history of seizure, OSA, COPD, history of syncope, hypertension, breast cancer with bilateral mastectomies, diabetes, neuropathy, hypertension and morbid obesity with a BMI of over 50 w status post weight loss surgery, who reports a prior diagnosis of obstructive sleep apnea, and RLS. The patient endorsed the Epworth Sleepiness Scale at 20/24 points. The patient's weight 324 pounds with a height of 66 (inches), resulting in a BMI of 52.1 kg/m2. The patient's neck circumference measured 15 inches.  CURRENT MEDICATIONS: Aspirin, Lioresal, Belbuca, Cyanocobalamin, Advair, Folvite, Lasix, Norco, Atarax, Antivert, Lopressor, Singulair, Multivitamin, Nitrostat, K-Dur, Mirapex, Proair, Zoloft, Topamax, Drisdol.   PROCEDURE:  This is a multichannel digital polysomnogram utilizing the Somnostar 11.2 system.  Electrodes and sensors were applied and monitored per AASM Specifications.   EEG, EOG, Chin and Limb EMG, were sampled at 200 Hz.  ECG, Snore and Nasal Pressure, Thermal Airflow, Respiratory Effort, CPAP Flow and Pressure, Oximetry was sampled at 50 Hz. Digital video and audio were recorded.      BASELINE STUDY  Lights Out was at 22:18 and Lights On at 04:59.  Total recording time (TRT) was 402 minutes, with a total sleep time (TST) of 390 minutes.   The patient's sleep latency was 3 minutes.  REM latency was 185 minutes, which is delayed. The sleep efficiency was 97. %.     SLEEP ARCHITECTURE: WASO (Wake after sleep onset) was 8.5 minutes.  There were 2.5 minutes in Stage N1, 289 minutes Stage N2, 62 minutes Stage N3 and 36.5 minutes in Stage REM.  The percentage of Stage N1 was .6%, Stage N2 was 74.1%, which  is increased, Stage N3 was 15.9% and Stage R (REM sleep) was 9.4%, which is reduced. The arousals were noted as: 51 were spontaneous, 0 were associated with PLMs, 2 were associated with respiratory events.   RESPIRATORY ANALYSIS:  There were a total of 33 respiratory events:  6 obstructive apneas, 2 central apneas and 0 mixed apneas with a total of 8 apneas and an apnea index (AI) of 1.2 /hour. There were 25 hypopneas with a hypopnea index of 3.8 /hour. The patient also had 0 respiratory event related arousals (RERAs).      The total APNEA/HYPOPNEA INDEX (AHI) was 5.1 /hour and the total RESPIRATORY DISTURBANCE INDEX was 5.1 /hour.  7 events occurred in REM sleep and 44 events in NREM. The REM AHI was 11.5 /hour, versus a non-REM AHI of 4.4. The patient spent 261.5 minutes of total sleep time in the supine position and 129 minutes in non-supine.. The supine AHI was 4.1 versus a non-supine AHI of 7.0.  OXYGEN SATURATION & C02:  The Wake baseline 02 saturation was 96%, with the lowest being 82%. Time spent below 89% saturation equaled 7 minutes.  PERIODIC LIMB MOVEMENTS: The patient had a total of 0 Periodic Limb Movements.  The Periodic Limb Movement (PLM) index was 0 and the PLM Arousal index was 0/hour.  Audio and video analysis did not show any abnormal or unusual movements, behaviors, phonations or vocalizations. The patient took 1 bathroom break. Intermittent mild to moderate snoring was noted. The EKG was in keeping with normal sinus rhythm (NSR).  Post-study, the patient indicated that sleep was the same as usual.   IMPRESSION:  1. Obstructive Sleep Apnea (OSA)  RECOMMENDATIONS:  1. This study demonstrates overall borderline obstructive sleep apnea, with a total AHI of 5.1/hour, REM AHI of 11.5/hour, and O2 nadir of 82%. Given the patient's medical history and sleep related complaints, treatment with positive airway pressure can be tried or continued; this can be achieved in the form of  autoPAP. Other treatment options may include avoidance of supine sleep position along with weight loss, upper airway or jaw surgery in selected patients or the use of an oral appliance in certain patients. ENT evaluation and/or consultation with a maxillofacial surgeon or dentist may be feasible in some instances. Weight loss should be pursued.    2. Please note that untreated obstructive sleep apnea may carry additional perioperative morbidity. Patients with significant obstructive sleep apnea should receive perioperative PAP therapy and the surgeons and particularly the anesthesiologist should be informed of the diagnosis and the severity of the sleep disordered breathing. 3. The patient should be cautioned not to drive, work at heights, or operate dangerous or heavy equipment when tired or sleepy. Review and reiteration of good sleep hygiene measures should be pursued with any patient. Given her severe sleepiness reported, reduction of potentially sedating medications should be pursued as much as possible and feasible.  4. The patient will be seen in follow-up by Dr[facility] at [facility] for discussion of the test results and further management strategies. The referring provider will be notified of the test results.  I certify that I have reviewed the entire raw data recording prior to the issuance of this report in accordance with the Standards of Accreditation of the American Academy of Sleep Medicine (AASM)   Huston FoleySaima Orville Mena, MD, PhD Diplomat, American Board of Neurology and Sleep Medicine (Neurology and Sleep Medicine)

## 2018-10-11 NOTE — Addendum Note (Signed)
Addended by: Huston Foley on: 10/11/2018 08:30 AM   Modules accepted: Orders

## 2018-10-11 NOTE — Progress Notes (Signed)
Patient referred by Dr. Sherril Croon for OSA reeval and Hx of RLS, seen by me on 09/19/18, diagnostic PSG on 10/07/18.    Please call and notify the patient that the recent sleep study showed borderline OSA and no PLMs. I don't know the setting on her old CPAP. She may be eligible for a new machine. I recommend treatment for this in the form of autoPAP, which means, that we don't have to bring her back for a second sleep study with CPAP, but will let her try an autoPAP machine at home, through a DME company (of her choice, or as per insurance requirement). The DME representative will educate her on how to use the machine, how to put the mask on, etc. I have placed an order in the chart. Please send referral, talk to patient, send report to referring MD. We will need a FU in sleep clinic for 10 weeks post-PAP set up, please arrange that with me or one of our NPs. Thanks,   Huston Foley, MD, PhD [facility])

## 2018-10-11 NOTE — Telephone Encounter (Signed)
-----   Message from Huston Foley, MD sent at 10/11/2018  8:30 AM EST ----- Patient referred by Dr. Sherril Croon for OSA reeval and Hx of RLS, seen by me on 09/19/18, diagnostic PSG on 10/07/18.    Please call and notify the patient that the recent sleep study showed borderline OSA and no PLMs. I don't know the setting on her old CPAP. She may be eligible for a new machine. I recommend treatment for this in the form of autoPAP, which means, that we don't have to bring her back for a second sleep study with CPAP, but will let her try an autoPAP machine at home, through a DME company (of her choice, or as per insurance requirement). The DME representative will educate her on how to use the machine, how to put the mask on, etc. I have placed an order in the chart. Please send referral, talk to patient, send report to referring MD. We will need a FU in sleep clinic for 10 weeks post-PAP set up, please arrange that with me or one of our NPs. Thanks,   Huston Foley, MD, PhD [facility])

## 2018-10-11 NOTE — Telephone Encounter (Signed)
I called pt. I advised pt that Dr. Frances FurbishAthar reviewed their sleep study results and found that pt has borderline osa with no PLMs. Dr. Frances FurbishAthar recommends that pt start an auto pap at home. I reviewed PAP compliance expectations with the pt. Pt is agreeable to starting an auto-PAP. I advised pt that an order will be sent to a DME, [facility], and [facility] will call the pt within about one week after they file with the pt's insurance. [facility] will show the pt how to use the machine, fit for masks, and troubleshoot the auto-PAP if needed. A follow up appt was made for insurance purposes with Dr. Frances FurbishAthar on 01/02/2019 at 11:30am. Pt verbalized understanding to arrive 15 minutes early and bring their auto-PAP. A letter with all of this information in it will be mailed to the pt as a reminder. I verified with the pt that the address we have on file is correct. Pt verbalized understanding of results. Pt had no questions at this time but was encouraged to call back if questions arise. I have sent the order to [facility] and have received confirmation that they have received the order.

## 2018-12-29 ENCOUNTER — Telehealth: Payer: Self-pay | Admitting: Family Medicine

## 2018-12-29 NOTE — Telephone Encounter (Signed)
I called and spoke with the patient regarding changing their apt to a VV due to the COVID-19.  I explained to the patient that we would file their insurance and they gave consent. I also walked through the steps of downloading the app, and starting the virtual meeting, after confirming the patient had access to a smart phone with a working camera and microphone access.   Meeting number: [phone] Password: [email or web address] Host key: [id number]

## 2019-01-02 ENCOUNTER — Encounter: Payer: Self-pay | Admitting: Family Medicine

## 2019-01-02 ENCOUNTER — Ambulatory Visit: Payer: Self-pay | Admitting: Neurology

## 2019-01-02 NOTE — Telephone Encounter (Signed)
I called pt to update her chart for her VV tomorrow at 1300. She stated that she has stress test scheduled tomorrow and was not able to make appt then.  We changed to 01-04-19 at 1300. Pt did consent to VIRTUAL VISIT.  Due to current COVID 19 pandemic, our office is severely reducing in office visits for at least the next 2 weeks, in order to minimize the risk to our patients and healthcare providers.  Pt understands that although there may be some limitations with this type of visit, we will take all precautions to reduce any security or privacy concerns.  Pt understands that this will be treated like an in office visit and we will file with pt's insurance.  Pt's email is [email or web address]. Pt understands that the cisco webex software must be downloaded and operational on the device pt plans to use for the visit.  Chart updated.

## 2019-01-03 ENCOUNTER — Ambulatory Visit: Payer: Self-pay | Admitting: Family Medicine

## 2019-01-04 ENCOUNTER — Telehealth: Payer: Self-pay

## 2019-01-04 ENCOUNTER — Other Ambulatory Visit: Payer: Self-pay

## 2019-01-04 ENCOUNTER — Encounter: Payer: Self-pay | Admitting: Family Medicine

## 2019-01-04 ENCOUNTER — Ambulatory Visit (INDEPENDENT_AMBULATORY_CARE_PROVIDER_SITE_OTHER): Payer: Medicare Other | Admitting: Family Medicine

## 2019-01-04 DIAGNOSIS — G4733 Obstructive sleep apnea (adult) (pediatric): Secondary | ICD-10-CM | POA: Diagnosis not present

## 2019-01-04 NOTE — Telephone Encounter (Signed)
Patient had an appointment with Shawnie Dapper, NP today 01/09/2019. New DME orders have faxed to [facility] P: [phone] F: [phone].  Confirmation fax has been received.

## 2019-01-04 NOTE — Progress Notes (Addendum)
PATIENT: Whitney Riley DOB: 19-Jun-1966  REASON FOR VISIT: follow up HISTORY FROM: patient  Virtual Visit via Telephone Note  I connected with Whitney Riley on 01/04/19 at  1:00 PM EDT by telephone and verified that I am speaking with the correct person using two identifiers.   I discussed the limitations, risks, security and privacy concerns of performing an evaluation and management service by telephone and the availability of in person appointments. I also discussed with the patient that there may be a patient responsible charge related to this service. The patient expressed understanding and agreed to proceed.   History of Present Illness:  01/04/19 Whitney Riley is a 53 y.o. female for follow up of OSA on CPAP.  She has Claudette LawsWatson reports that she is doing very well with CPAP therapy.  She has noted significant benefit of less headaches in the mornings.  She is tolerating therapy.  She does state that she had an upper respiratory infection that prevented her from using her machine a couple nights but otherwise she is using it consistently.  Download report as follows:    History (copied from Dr Teofilo PodAthar's note on 09/19/2018)  Dear Herminio Commonshruv,   I saw your patient, Whitney Riley, upon your kind request, in my neurologic clinic today for initial consultation of her leg pain, concern for including her restless leg syndrome. The patient is accompanied by her daughter today. As you know, Ms. Claudette LawsWatson is a 53 year old right-handed woman with an underlying medical history of back pain (for which she has seen Dr. Gerilyn Pilgrimoonquah), reflux disease, asthma, history of seizure, OSA, COPD, history of syncope, hypertension, breast cancer with bilateral mastectomies, diabetes, neuropathy, hypertension and morbid obesity with a BMI of over 50 w status post weight loss surgery, who reports a prior diagnosis of obstructive sleep apnea, she has been on CPAP therapy for this but has not seen a sleep provider in sometime,  sleep study was over 5 years ago and her machine is over 55 years old. Prior sleep study results are not available for my review today and a CPAP download was not available for my review today. She reports using her CPAP. She has been on Mirapex 1 mg bid for her restless leg symptoms. She also follows with pain management for back pain and uses Buprenorphine, and hydrocodone. Of note, she is on other potentially sedating medications including high-dose sertraline 200 mg daily, Topamax generic 100 mg twice daily, baclofen 10 mg 3 times a day and lithium 300 mg twice daily, meclizine 25 mg 3 times a day. She is followed by pain management. She is in the process of establishing care with a new psychiatrist as I understand. Her Epworth sleepiness score is 20 out of 24 today, fatigue score is 53 out of 63. She is married and lives with her husband and her daughter. She has 2 children. She doesnt work. She does not smoke cigarettes and drinks alcohol occasionally, caffeine in the form of coffee, 2 cups per day on average.  [place] moved [place], [place], to [place], [place]. She  reports having had restless legs syndrome since the 90s. She has had neuropathy for years, had EMG and nerve conduction testing years ago under Dr. Gerilyn Pilgrimoonquah. She was on gabapentin in the past and recently tried another medication which did not help or cause side effects. She does not recall the name of the prescription. She also reports that Dr. Gerilyn Pilgrimoonquah dismissed her from his practice, as she did  not follow his recommendation on the new medication. I do not have any records available from her previous neurologists. She also saw a neurologist in [place]. She takes the Mirapex twice daily, around 1 PM and bedtime which is currently around 9:30 or 10. Some nights she sleeps well and some nights she doesnt. She tries to be consistent with her CPAP but wonders if it needs adjustment. She used to be on a pressure of 12 but  some years ago was reduced to 7 cm she believes. She did not bring her machine. She was told that she had severe sleep apnea. She has lost weight in the past secondary to gastric bypass surgery in 2006. Her highest weight was around 600 pounds she states. Her rise time is around 5:30 AM because her husband gets up at the time. As far as her restless legs, she has some nights with significant discomfort and urges to move her legs, moving or shifting her legs alleviates some of the symptoms. As far as her neuropathy, she has some discomfort, some aching, some burning sensation in her feet and some numbness. She has nocturia about 3 times per average night and has had the occasional morning headache, these improved after she started CPAP therapy. She reports that she has been on CPAP therapy for years, was diagnosed with sleep apnea in the 90s. Her father has sleep apnea and one of her daughters has sleep apnea. The other daughter has not been tested but snores loudly. Patient had a tonsillectomy at age 29.   Observations/Objective:  Generalized: Well developed, in no acute distress  Mentation: Alert oriented to time, place, history taking. Follows all commands speech and language fluent   Assessment and Plan:  53 y.o. year old female  has a past medical history of Atrial fibrillation (HCC), Bipolar mood disorder (HCC), Blindness of right eye, CAD (coronary artery disease), COPD (chronic obstructive pulmonary disease) (HCC), Diabetes mellitus (HCC), H/O juvenile rheumatoid arthritis, breast cancer, Morbid obesity (HCC), Myocardial infarction (HCC) (2006), OSA (obstructive sleep apnea), and Syncope. with    ICD-10-CM   1. OSA (obstructive sleep apnea) G47.33 For home use only DME continuous positive airway pressure (CPAP)   Ms. Oceguera is doing very well with excellent compliance on CPAP therapy.  She was encouraged to continue nightly use and for greater than 4 hours each night.  We will place an order  for supplies per her request.  I have advised one-year follow-up.  She verbalizes understanding and agreement with this plan.  Orders Placed This Encounter  Procedures   For home use only DME continuous positive airway pressure (CPAP)    Supplies please    Order Specific Question:   Patient has OSA or probable OSA    Answer:   Yes    Order Specific Question:   Is the patient currently using CPAP in the home    Answer:   Yes    Order Specific Question:   Settings    Answer:   Other see comments    Order Specific Question:   CPAP supplies needed    Answer:   Mask, headgear, cushions, filters, heated tubing and water chamber    No orders of the defined types were placed in this encounter.    Follow Up Instructions:  I discussed the assessment and treatment plan with the patient. The patient was provided an opportunity to ask questions and all were answered. The patient agreed with the plan and demonstrated an understanding of the  instructions.   The patient was advised to call back or seek an in-person evaluation if the symptoms worsen or if the condition fails to improve as anticipated.  I provided 20 minutes of non-face-to-face time during this encounter.  Patient is in her car during video conference.  Provider is located at her place of residence.  Alverda Skeans, RN helped to facilitate visit.   Shawnie Dapper, NP   I reviewed the above note and documentation by the Nurse Practitioner and agree with the history, assessment and plan as outlined above. I was immediately available for phone consultation. Huston Foley, MD, PhD [facility])

## 2019-01-25 ENCOUNTER — Telehealth: Payer: Self-pay

## 2019-01-25 NOTE — Telephone Encounter (Signed)
Unable to get in contact with the patient to schedule their yearly follow up with Amy. I left a voicemail asking the patient to return my call. Office number was provided.  If patient calls back please schedule their follow up appt.   

## 2019-09-20 DIAGNOSIS — M5136 Other intervertebral disc degeneration, lumbar region: Secondary | ICD-10-CM | POA: Diagnosis not present

## 2019-09-20 DIAGNOSIS — Z79899 Other long term (current) drug therapy: Secondary | ICD-10-CM | POA: Diagnosis not present

## 2019-09-20 DIAGNOSIS — G894 Chronic pain syndrome: Secondary | ICD-10-CM | POA: Diagnosis not present

## 2019-09-21 DIAGNOSIS — I2510 Atherosclerotic heart disease of native coronary artery without angina pectoris: Secondary | ICD-10-CM | POA: Diagnosis not present

## 2019-09-21 DIAGNOSIS — J449 Chronic obstructive pulmonary disease, unspecified: Secondary | ICD-10-CM | POA: Diagnosis not present

## 2019-09-21 DIAGNOSIS — E7800 Pure hypercholesterolemia, unspecified: Secondary | ICD-10-CM | POA: Diagnosis not present

## 2019-09-28 DIAGNOSIS — G4733 Obstructive sleep apnea (adult) (pediatric): Secondary | ICD-10-CM | POA: Diagnosis not present

## 2019-10-04 DIAGNOSIS — M5136 Other intervertebral disc degeneration, lumbar region: Secondary | ICD-10-CM | POA: Diagnosis not present

## 2019-10-04 DIAGNOSIS — Z79899 Other long term (current) drug therapy: Secondary | ICD-10-CM | POA: Diagnosis not present

## 2019-10-04 DIAGNOSIS — G894 Chronic pain syndrome: Secondary | ICD-10-CM | POA: Diagnosis not present

## 2019-10-23 DIAGNOSIS — G894 Chronic pain syndrome: Secondary | ICD-10-CM | POA: Diagnosis not present

## 2019-10-23 DIAGNOSIS — G8929 Other chronic pain: Secondary | ICD-10-CM | POA: Diagnosis not present

## 2019-10-23 DIAGNOSIS — Z79899 Other long term (current) drug therapy: Secondary | ICD-10-CM | POA: Diagnosis not present

## 2019-10-23 DIAGNOSIS — M1990 Unspecified osteoarthritis, unspecified site: Secondary | ICD-10-CM | POA: Diagnosis not present

## 2019-10-23 DIAGNOSIS — M549 Dorsalgia, unspecified: Secondary | ICD-10-CM | POA: Diagnosis not present

## 2019-10-28 DIAGNOSIS — G894 Chronic pain syndrome: Secondary | ICD-10-CM | POA: Diagnosis not present

## 2019-10-28 DIAGNOSIS — Z79899 Other long term (current) drug therapy: Secondary | ICD-10-CM | POA: Diagnosis not present

## 2019-11-04 DIAGNOSIS — G894 Chronic pain syndrome: Secondary | ICD-10-CM | POA: Diagnosis not present

## 2019-11-04 DIAGNOSIS — Z79899 Other long term (current) drug therapy: Secondary | ICD-10-CM | POA: Diagnosis not present

## 2019-11-20 DIAGNOSIS — G894 Chronic pain syndrome: Secondary | ICD-10-CM | POA: Diagnosis not present

## 2019-11-20 DIAGNOSIS — Z79899 Other long term (current) drug therapy: Secondary | ICD-10-CM | POA: Diagnosis not present

## 2019-11-20 DIAGNOSIS — M5136 Other intervertebral disc degeneration, lumbar region: Secondary | ICD-10-CM | POA: Diagnosis not present

## 2019-11-25 DIAGNOSIS — Z79899 Other long term (current) drug therapy: Secondary | ICD-10-CM | POA: Diagnosis not present

## 2019-11-25 DIAGNOSIS — G894 Chronic pain syndrome: Secondary | ICD-10-CM | POA: Diagnosis not present

## 2019-12-02 DIAGNOSIS — Z79899 Other long term (current) drug therapy: Secondary | ICD-10-CM | POA: Diagnosis not present

## 2019-12-02 DIAGNOSIS — G894 Chronic pain syndrome: Secondary | ICD-10-CM | POA: Diagnosis not present

## 2019-12-21 DIAGNOSIS — Z79899 Other long term (current) drug therapy: Secondary | ICD-10-CM | POA: Diagnosis not present

## 2019-12-21 DIAGNOSIS — M5136 Other intervertebral disc degeneration, lumbar region: Secondary | ICD-10-CM | POA: Diagnosis not present

## 2019-12-21 DIAGNOSIS — M1990 Unspecified osteoarthritis, unspecified site: Secondary | ICD-10-CM | POA: Diagnosis not present

## 2019-12-21 DIAGNOSIS — G894 Chronic pain syndrome: Secondary | ICD-10-CM | POA: Diagnosis not present

## 2019-12-26 DIAGNOSIS — Z79899 Other long term (current) drug therapy: Secondary | ICD-10-CM | POA: Diagnosis not present

## 2019-12-26 DIAGNOSIS — G894 Chronic pain syndrome: Secondary | ICD-10-CM | POA: Diagnosis not present

## 2020-01-02 DIAGNOSIS — Z79899 Other long term (current) drug therapy: Secondary | ICD-10-CM | POA: Diagnosis not present

## 2020-01-02 DIAGNOSIS — G894 Chronic pain syndrome: Secondary | ICD-10-CM | POA: Diagnosis not present

## 2020-01-22 DIAGNOSIS — G894 Chronic pain syndrome: Secondary | ICD-10-CM | POA: Diagnosis not present

## 2020-01-22 DIAGNOSIS — M549 Dorsalgia, unspecified: Secondary | ICD-10-CM | POA: Diagnosis not present

## 2020-01-22 DIAGNOSIS — E559 Vitamin D deficiency, unspecified: Secondary | ICD-10-CM | POA: Diagnosis not present

## 2020-01-22 DIAGNOSIS — Z79899 Other long term (current) drug therapy: Secondary | ICD-10-CM | POA: Diagnosis not present

## 2020-01-22 DIAGNOSIS — G8929 Other chronic pain: Secondary | ICD-10-CM | POA: Diagnosis not present

## 2020-01-24 DIAGNOSIS — M549 Dorsalgia, unspecified: Secondary | ICD-10-CM | POA: Diagnosis not present

## 2020-01-24 DIAGNOSIS — M1990 Unspecified osteoarthritis, unspecified site: Secondary | ICD-10-CM | POA: Diagnosis not present

## 2020-01-25 DIAGNOSIS — G894 Chronic pain syndrome: Secondary | ICD-10-CM | POA: Diagnosis not present

## 2020-01-25 DIAGNOSIS — Z79899 Other long term (current) drug therapy: Secondary | ICD-10-CM | POA: Diagnosis not present

## 2020-02-01 DIAGNOSIS — G894 Chronic pain syndrome: Secondary | ICD-10-CM | POA: Diagnosis not present

## 2020-02-01 DIAGNOSIS — Z79899 Other long term (current) drug therapy: Secondary | ICD-10-CM | POA: Diagnosis not present

## 2020-02-06 DIAGNOSIS — Z789 Other specified health status: Secondary | ICD-10-CM | POA: Diagnosis not present

## 2020-02-06 DIAGNOSIS — J069 Acute upper respiratory infection, unspecified: Secondary | ICD-10-CM | POA: Diagnosis not present

## 2020-02-06 DIAGNOSIS — Z299 Encounter for prophylactic measures, unspecified: Secondary | ICD-10-CM | POA: Diagnosis not present

## 2020-02-06 DIAGNOSIS — J449 Chronic obstructive pulmonary disease, unspecified: Secondary | ICD-10-CM | POA: Diagnosis not present

## 2020-03-03 DIAGNOSIS — Z79899 Other long term (current) drug therapy: Secondary | ICD-10-CM | POA: Diagnosis not present

## 2020-03-03 DIAGNOSIS — G894 Chronic pain syndrome: Secondary | ICD-10-CM | POA: Diagnosis not present

## 2020-03-04 DIAGNOSIS — G8929 Other chronic pain: Secondary | ICD-10-CM | POA: Diagnosis not present

## 2020-03-04 DIAGNOSIS — G894 Chronic pain syndrome: Secondary | ICD-10-CM | POA: Diagnosis not present

## 2020-03-04 DIAGNOSIS — M549 Dorsalgia, unspecified: Secondary | ICD-10-CM | POA: Diagnosis not present

## 2020-03-04 DIAGNOSIS — M1990 Unspecified osteoarthritis, unspecified site: Secondary | ICD-10-CM | POA: Diagnosis not present

## 2020-03-04 DIAGNOSIS — Z79899 Other long term (current) drug therapy: Secondary | ICD-10-CM | POA: Diagnosis not present

## 2020-03-06 DIAGNOSIS — J449 Chronic obstructive pulmonary disease, unspecified: Secondary | ICD-10-CM | POA: Diagnosis not present

## 2020-03-06 DIAGNOSIS — E7800 Pure hypercholesterolemia, unspecified: Secondary | ICD-10-CM | POA: Diagnosis not present

## 2020-03-06 DIAGNOSIS — I2510 Atherosclerotic heart disease of native coronary artery without angina pectoris: Secondary | ICD-10-CM | POA: Diagnosis not present

## 2020-03-14 DIAGNOSIS — Z7189 Other specified counseling: Secondary | ICD-10-CM | POA: Diagnosis not present

## 2020-03-14 DIAGNOSIS — E7800 Pure hypercholesterolemia, unspecified: Secondary | ICD-10-CM | POA: Diagnosis not present

## 2020-03-14 DIAGNOSIS — Z299 Encounter for prophylactic measures, unspecified: Secondary | ICD-10-CM | POA: Diagnosis not present

## 2020-03-14 DIAGNOSIS — Z0000 Encounter for general adult medical examination without abnormal findings: Secondary | ICD-10-CM | POA: Diagnosis not present

## 2020-03-14 DIAGNOSIS — Z1211 Encounter for screening for malignant neoplasm of colon: Secondary | ICD-10-CM | POA: Diagnosis not present

## 2020-03-14 DIAGNOSIS — R5383 Other fatigue: Secondary | ICD-10-CM | POA: Diagnosis not present

## 2020-03-14 DIAGNOSIS — I10 Essential (primary) hypertension: Secondary | ICD-10-CM | POA: Diagnosis not present

## 2020-04-02 DIAGNOSIS — M1990 Unspecified osteoarthritis, unspecified site: Secondary | ICD-10-CM | POA: Diagnosis not present

## 2020-04-02 DIAGNOSIS — M549 Dorsalgia, unspecified: Secondary | ICD-10-CM | POA: Diagnosis not present

## 2020-04-02 DIAGNOSIS — G894 Chronic pain syndrome: Secondary | ICD-10-CM | POA: Diagnosis not present

## 2020-04-02 DIAGNOSIS — Z79899 Other long term (current) drug therapy: Secondary | ICD-10-CM | POA: Diagnosis not present

## 2020-04-02 DIAGNOSIS — G8929 Other chronic pain: Secondary | ICD-10-CM | POA: Diagnosis not present

## 2020-04-05 DIAGNOSIS — I2510 Atherosclerotic heart disease of native coronary artery without angina pectoris: Secondary | ICD-10-CM | POA: Diagnosis not present

## 2020-04-05 DIAGNOSIS — J449 Chronic obstructive pulmonary disease, unspecified: Secondary | ICD-10-CM | POA: Diagnosis not present

## 2020-04-05 DIAGNOSIS — E7800 Pure hypercholesterolemia, unspecified: Secondary | ICD-10-CM | POA: Diagnosis not present

## 2020-04-10 DIAGNOSIS — J449 Chronic obstructive pulmonary disease, unspecified: Secondary | ICD-10-CM | POA: Diagnosis not present

## 2020-04-10 DIAGNOSIS — I2510 Atherosclerotic heart disease of native coronary artery without angina pectoris: Secondary | ICD-10-CM | POA: Diagnosis not present

## 2020-04-10 DIAGNOSIS — E7800 Pure hypercholesterolemia, unspecified: Secondary | ICD-10-CM | POA: Diagnosis not present

## 2020-05-03 DIAGNOSIS — M5136 Other intervertebral disc degeneration, lumbar region: Secondary | ICD-10-CM | POA: Diagnosis not present

## 2020-05-03 DIAGNOSIS — G894 Chronic pain syndrome: Secondary | ICD-10-CM | POA: Diagnosis not present

## 2020-05-03 DIAGNOSIS — Z79899 Other long term (current) drug therapy: Secondary | ICD-10-CM | POA: Diagnosis not present

## 2020-06-04 DIAGNOSIS — M5136 Other intervertebral disc degeneration, lumbar region: Secondary | ICD-10-CM | POA: Diagnosis not present

## 2020-06-04 DIAGNOSIS — G894 Chronic pain syndrome: Secondary | ICD-10-CM | POA: Diagnosis not present

## 2020-06-04 DIAGNOSIS — Z79899 Other long term (current) drug therapy: Secondary | ICD-10-CM | POA: Diagnosis not present

## 2020-06-06 DIAGNOSIS — J449 Chronic obstructive pulmonary disease, unspecified: Secondary | ICD-10-CM | POA: Diagnosis not present

## 2020-06-06 DIAGNOSIS — I2510 Atherosclerotic heart disease of native coronary artery without angina pectoris: Secondary | ICD-10-CM | POA: Diagnosis not present

## 2020-06-06 DIAGNOSIS — E7800 Pure hypercholesterolemia, unspecified: Secondary | ICD-10-CM | POA: Diagnosis not present

## 2020-07-05 DIAGNOSIS — E7800 Pure hypercholesterolemia, unspecified: Secondary | ICD-10-CM | POA: Diagnosis not present

## 2020-07-05 DIAGNOSIS — J449 Chronic obstructive pulmonary disease, unspecified: Secondary | ICD-10-CM | POA: Diagnosis not present

## 2020-07-05 DIAGNOSIS — I2510 Atherosclerotic heart disease of native coronary artery without angina pectoris: Secondary | ICD-10-CM | POA: Diagnosis not present

## 2020-07-09 DIAGNOSIS — M5136 Other intervertebral disc degeneration, lumbar region: Secondary | ICD-10-CM | POA: Diagnosis not present

## 2020-07-09 DIAGNOSIS — Z79899 Other long term (current) drug therapy: Secondary | ICD-10-CM | POA: Diagnosis not present

## 2020-07-09 DIAGNOSIS — G894 Chronic pain syndrome: Secondary | ICD-10-CM | POA: Diagnosis not present

## 2020-08-06 DIAGNOSIS — R21 Rash and other nonspecific skin eruption: Secondary | ICD-10-CM | POA: Diagnosis not present

## 2020-08-06 DIAGNOSIS — I2510 Atherosclerotic heart disease of native coronary artery without angina pectoris: Secondary | ICD-10-CM | POA: Diagnosis not present

## 2020-08-06 DIAGNOSIS — I10 Essential (primary) hypertension: Secondary | ICD-10-CM | POA: Diagnosis not present

## 2020-08-06 DIAGNOSIS — Z299 Encounter for prophylactic measures, unspecified: Secondary | ICD-10-CM | POA: Diagnosis not present

## 2020-08-06 DIAGNOSIS — J449 Chronic obstructive pulmonary disease, unspecified: Secondary | ICD-10-CM | POA: Diagnosis not present

## 2020-08-06 DIAGNOSIS — E7800 Pure hypercholesterolemia, unspecified: Secondary | ICD-10-CM | POA: Diagnosis not present

## 2020-08-15 DIAGNOSIS — Z79899 Other long term (current) drug therapy: Secondary | ICD-10-CM | POA: Diagnosis not present

## 2020-08-15 DIAGNOSIS — M5136 Other intervertebral disc degeneration, lumbar region: Secondary | ICD-10-CM | POA: Diagnosis not present

## 2020-08-15 DIAGNOSIS — G894 Chronic pain syndrome: Secondary | ICD-10-CM | POA: Diagnosis not present

## 2020-09-11 DIAGNOSIS — G894 Chronic pain syndrome: Secondary | ICD-10-CM | POA: Diagnosis not present

## 2020-09-11 DIAGNOSIS — G8929 Other chronic pain: Secondary | ICD-10-CM | POA: Diagnosis not present

## 2020-09-11 DIAGNOSIS — M549 Dorsalgia, unspecified: Secondary | ICD-10-CM | POA: Diagnosis not present

## 2020-09-11 DIAGNOSIS — Z79899 Other long term (current) drug therapy: Secondary | ICD-10-CM | POA: Diagnosis not present
